# Patient Record
Sex: Female | Born: 1985
Health system: Southern US, Community
[De-identification: ages and names within clinical notes are randomized; demographics above are authoritative.]

## PROBLEM LIST (undated history)

## (undated) ENCOUNTER — Inpatient Hospital Stay (HOSPITAL_COMMUNITY): Payer: Self-pay

## (undated) DIAGNOSIS — D649 Anemia, unspecified: Secondary | ICD-10-CM

## (undated) DIAGNOSIS — K439 Ventral hernia without obstruction or gangrene: Secondary | ICD-10-CM

## (undated) DIAGNOSIS — R87629 Unspecified abnormal cytological findings in specimens from vagina: Secondary | ICD-10-CM

## (undated) DIAGNOSIS — Z8719 Personal history of other diseases of the digestive system: Secondary | ICD-10-CM

## (undated) DIAGNOSIS — K660 Peritoneal adhesions (postprocedural) (postinfection): Secondary | ICD-10-CM

## (undated) DIAGNOSIS — A749 Chlamydial infection, unspecified: Secondary | ICD-10-CM

## (undated) HISTORY — PX: HERNIA REPAIR: SHX51

---
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
## 2004-01-28 ENCOUNTER — Encounter: Admission: RE | Admit: 2004-01-28 | Discharge: 2004-01-28 | Payer: Self-pay | Admitting: Family Medicine

## 2004-01-28 ENCOUNTER — Other Ambulatory Visit: Admission: RE | Admit: 2004-01-28 | Discharge: 2004-01-28 | Payer: Self-pay | Admitting: Family Medicine

## 2004-01-31 ENCOUNTER — Other Ambulatory Visit: Admission: RE | Admit: 2004-01-31 | Discharge: 2004-01-31 | Payer: Self-pay | Admitting: Family Medicine

## 2004-02-17 ENCOUNTER — Encounter: Admission: RE | Admit: 2004-02-17 | Discharge: 2004-02-17 | Payer: Self-pay | Admitting: Sports Medicine

## 2004-02-22 ENCOUNTER — Encounter: Admission: RE | Admit: 2004-02-22 | Discharge: 2004-02-22 | Payer: Self-pay | Admitting: Family Medicine

## 2004-03-08 ENCOUNTER — Encounter: Admission: RE | Admit: 2004-03-08 | Discharge: 2004-03-08 | Payer: Self-pay | Admitting: Family Medicine

## 2004-07-26 ENCOUNTER — Emergency Department (HOSPITAL_COMMUNITY): Admission: EM | Admit: 2004-07-26 | Discharge: 2004-07-26 | Payer: Self-pay | Admitting: Emergency Medicine

## 2004-07-27 ENCOUNTER — Emergency Department (HOSPITAL_COMMUNITY): Admission: EM | Admit: 2004-07-27 | Discharge: 2004-07-27 | Payer: Self-pay | Admitting: Family Medicine

## 2004-08-02 ENCOUNTER — Emergency Department (HOSPITAL_COMMUNITY): Admission: EM | Admit: 2004-08-02 | Discharge: 2004-08-02 | Payer: Self-pay | Admitting: Family Medicine

## 2004-08-13 ENCOUNTER — Emergency Department (HOSPITAL_COMMUNITY): Admission: EM | Admit: 2004-08-13 | Discharge: 2004-08-13 | Payer: Self-pay | Admitting: Family Medicine

## 2004-08-29 ENCOUNTER — Emergency Department (HOSPITAL_COMMUNITY): Admission: EM | Admit: 2004-08-29 | Discharge: 2004-08-29 | Payer: Self-pay | Admitting: Family Medicine

## 2004-11-17 ENCOUNTER — Ambulatory Visit: Payer: Self-pay | Admitting: Family Medicine

## 2005-01-12 ENCOUNTER — Emergency Department (HOSPITAL_COMMUNITY): Admission: EM | Admit: 2005-01-12 | Discharge: 2005-01-12 | Payer: Self-pay | Admitting: Family Medicine

## 2005-01-30 ENCOUNTER — Ambulatory Visit: Payer: Self-pay | Admitting: Sports Medicine

## 2005-02-09 ENCOUNTER — Ambulatory Visit: Payer: Self-pay | Admitting: Family Medicine

## 2005-04-06 ENCOUNTER — Ambulatory Visit: Payer: Self-pay | Admitting: Family Medicine

## 2005-05-26 ENCOUNTER — Emergency Department (HOSPITAL_COMMUNITY): Admission: EM | Admit: 2005-05-26 | Discharge: 2005-05-26 | Payer: Self-pay | Admitting: Family Medicine

## 2005-09-01 ENCOUNTER — Emergency Department (HOSPITAL_COMMUNITY): Admission: EM | Admit: 2005-09-01 | Discharge: 2005-09-01 | Payer: Self-pay | Admitting: Family Medicine

## 2005-09-10 ENCOUNTER — Ambulatory Visit: Payer: Self-pay | Admitting: Family Medicine

## 2005-10-09 ENCOUNTER — Ambulatory Visit: Payer: Self-pay | Admitting: Family Medicine

## 2005-10-12 ENCOUNTER — Ambulatory Visit: Payer: Self-pay | Admitting: Family Medicine

## 2005-11-01 ENCOUNTER — Ambulatory Visit: Payer: Self-pay | Admitting: Family Medicine

## 2005-12-07 ENCOUNTER — Encounter (INDEPENDENT_AMBULATORY_CARE_PROVIDER_SITE_OTHER): Payer: Self-pay | Admitting: *Deleted

## 2005-12-07 LAB — CONVERTED CEMR LAB

## 2005-12-14 ENCOUNTER — Ambulatory Visit: Payer: Self-pay | Admitting: Family Medicine

## 2005-12-17 ENCOUNTER — Ambulatory Visit (HOSPITAL_COMMUNITY): Admission: RE | Admit: 2005-12-17 | Discharge: 2005-12-17 | Payer: Self-pay | Admitting: Family Medicine

## 2005-12-27 ENCOUNTER — Ambulatory Visit: Payer: Self-pay | Admitting: Sports Medicine

## 2005-12-27 ENCOUNTER — Encounter (INDEPENDENT_AMBULATORY_CARE_PROVIDER_SITE_OTHER): Payer: Self-pay | Admitting: Specialist

## 2005-12-27 ENCOUNTER — Other Ambulatory Visit: Admission: RE | Admit: 2005-12-27 | Discharge: 2005-12-27 | Payer: Self-pay | Admitting: Family Medicine

## 2006-01-16 ENCOUNTER — Ambulatory Visit: Payer: Self-pay | Admitting: Family Medicine

## 2006-01-30 ENCOUNTER — Ambulatory Visit: Payer: Self-pay | Admitting: Sports Medicine

## 2006-02-04 ENCOUNTER — Ambulatory Visit (HOSPITAL_COMMUNITY): Admission: RE | Admit: 2006-02-04 | Discharge: 2006-02-04 | Payer: Self-pay | Admitting: Family Medicine

## 2006-02-12 ENCOUNTER — Ambulatory Visit: Payer: Self-pay | Admitting: Family Medicine

## 2006-02-28 ENCOUNTER — Ambulatory Visit: Payer: Self-pay | Admitting: Family Medicine

## 2006-03-26 ENCOUNTER — Ambulatory Visit: Payer: Self-pay | Admitting: Family Medicine

## 2006-03-28 ENCOUNTER — Inpatient Hospital Stay (HOSPITAL_COMMUNITY): Admission: AD | Admit: 2006-03-28 | Discharge: 2006-03-28 | Payer: Self-pay | Admitting: Family Medicine

## 2006-03-28 ENCOUNTER — Ambulatory Visit: Payer: Self-pay | Admitting: Obstetrics and Gynecology

## 2006-04-03 ENCOUNTER — Ambulatory Visit: Payer: Self-pay | Admitting: Family Medicine

## 2006-05-01 ENCOUNTER — Ambulatory Visit: Payer: Self-pay | Admitting: Sports Medicine

## 2006-05-09 ENCOUNTER — Ambulatory Visit: Payer: Self-pay | Admitting: Family Medicine

## 2006-05-27 ENCOUNTER — Ambulatory Visit: Payer: Self-pay | Admitting: Sports Medicine

## 2006-06-11 ENCOUNTER — Ambulatory Visit: Payer: Self-pay | Admitting: Family Medicine

## 2006-06-13 ENCOUNTER — Ambulatory Visit: Payer: Self-pay | Admitting: Family Medicine

## 2006-06-18 ENCOUNTER — Ambulatory Visit: Payer: Self-pay | Admitting: Family Medicine

## 2006-06-27 ENCOUNTER — Ambulatory Visit: Payer: Self-pay | Admitting: Family Medicine

## 2006-07-03 ENCOUNTER — Ambulatory Visit (HOSPITAL_COMMUNITY): Admission: RE | Admit: 2006-07-03 | Discharge: 2006-07-03 | Payer: Self-pay | Admitting: Family Medicine

## 2006-07-11 ENCOUNTER — Encounter (INDEPENDENT_AMBULATORY_CARE_PROVIDER_SITE_OTHER): Payer: Self-pay | Admitting: Family Medicine

## 2006-07-11 ENCOUNTER — Ambulatory Visit: Payer: Self-pay | Admitting: Family Medicine

## 2006-07-11 LAB — CONVERTED CEMR LAB
Chlamydia, DNA Probe: POSITIVE — AB
GC Probe Amp, Genital: POSITIVE — AB

## 2006-07-16 ENCOUNTER — Ambulatory Visit: Payer: Self-pay | Admitting: *Deleted

## 2006-07-16 ENCOUNTER — Inpatient Hospital Stay (HOSPITAL_COMMUNITY): Admission: AD | Admit: 2006-07-16 | Discharge: 2006-07-16 | Payer: Self-pay | Admitting: Obstetrics and Gynecology

## 2006-07-16 ENCOUNTER — Ambulatory Visit: Payer: Self-pay | Admitting: Family Medicine

## 2006-07-23 ENCOUNTER — Inpatient Hospital Stay (HOSPITAL_COMMUNITY): Admission: AD | Admit: 2006-07-23 | Discharge: 2006-07-23 | Payer: Self-pay | Admitting: Obstetrics and Gynecology

## 2006-07-23 ENCOUNTER — Ambulatory Visit: Payer: Self-pay | Admitting: *Deleted

## 2006-09-05 ENCOUNTER — Ambulatory Visit: Payer: Self-pay | Admitting: Family Medicine

## 2006-09-05 ENCOUNTER — Encounter (INDEPENDENT_AMBULATORY_CARE_PROVIDER_SITE_OTHER): Payer: Self-pay | Admitting: Family Medicine

## 2006-09-05 DIAGNOSIS — F17200 Nicotine dependence, unspecified, uncomplicated: Secondary | ICD-10-CM | POA: Insufficient documentation

## 2006-09-05 LAB — CONVERTED CEMR LAB: Chlamydia, DNA Probe: NEGATIVE

## 2006-09-06 ENCOUNTER — Encounter (INDEPENDENT_AMBULATORY_CARE_PROVIDER_SITE_OTHER): Payer: Self-pay | Admitting: *Deleted

## 2006-10-01 ENCOUNTER — Telehealth: Payer: Self-pay | Admitting: *Deleted

## 2006-10-01 ENCOUNTER — Telehealth (INDEPENDENT_AMBULATORY_CARE_PROVIDER_SITE_OTHER): Payer: Self-pay | Admitting: Family Medicine

## 2006-12-11 ENCOUNTER — Ambulatory Visit: Payer: Self-pay | Admitting: Family Medicine

## 2006-12-18 ENCOUNTER — Encounter (INDEPENDENT_AMBULATORY_CARE_PROVIDER_SITE_OTHER): Payer: Self-pay | Admitting: Family Medicine

## 2007-02-13 ENCOUNTER — Ambulatory Visit: Payer: Self-pay | Admitting: Family Medicine

## 2007-05-07 ENCOUNTER — Encounter: Payer: Self-pay | Admitting: *Deleted

## 2007-05-30 ENCOUNTER — Telehealth: Payer: Self-pay | Admitting: *Deleted

## 2007-06-19 ENCOUNTER — Encounter (INDEPENDENT_AMBULATORY_CARE_PROVIDER_SITE_OTHER): Payer: Self-pay | Admitting: Family Medicine

## 2007-06-19 ENCOUNTER — Other Ambulatory Visit: Admission: RE | Admit: 2007-06-19 | Discharge: 2007-06-19 | Payer: Self-pay | Admitting: Family Medicine

## 2007-06-19 ENCOUNTER — Ambulatory Visit: Payer: Self-pay | Admitting: Family Medicine

## 2007-06-19 LAB — CONVERTED CEMR LAB
Beta hcg, urine, semiquantitative: NEGATIVE
GC Probe Amp, Genital: NEGATIVE

## 2007-06-27 ENCOUNTER — Encounter (INDEPENDENT_AMBULATORY_CARE_PROVIDER_SITE_OTHER): Payer: Self-pay | Admitting: Family Medicine

## 2007-07-07 ENCOUNTER — Telehealth: Payer: Self-pay | Admitting: *Deleted

## 2007-07-13 ENCOUNTER — Encounter (INDEPENDENT_AMBULATORY_CARE_PROVIDER_SITE_OTHER): Payer: Self-pay | Admitting: Family Medicine

## 2007-07-22 ENCOUNTER — Ambulatory Visit: Payer: Self-pay | Admitting: Family Medicine

## 2007-07-22 ENCOUNTER — Encounter (INDEPENDENT_AMBULATORY_CARE_PROVIDER_SITE_OTHER): Payer: Self-pay | Admitting: Family Medicine

## 2007-08-27 ENCOUNTER — Telehealth: Payer: Self-pay | Admitting: *Deleted

## 2007-08-28 ENCOUNTER — Ambulatory Visit: Payer: Self-pay | Admitting: Family Medicine

## 2007-10-13 ENCOUNTER — Telehealth: Payer: Self-pay | Admitting: *Deleted

## 2007-10-14 ENCOUNTER — Encounter (INDEPENDENT_AMBULATORY_CARE_PROVIDER_SITE_OTHER): Payer: Self-pay | Admitting: Family Medicine

## 2007-10-14 ENCOUNTER — Ambulatory Visit: Payer: Self-pay | Admitting: Family Medicine

## 2007-10-14 LAB — CONVERTED CEMR LAB
GC Probe Amp, Genital: NEGATIVE
Protein, U semiquant: NEGATIVE
Specific Gravity, Urine: 1.025
Urobilinogen, UA: 0.2
WBC Urine, dipstick: NEGATIVE
pH: 6.5

## 2007-10-21 ENCOUNTER — Telehealth: Payer: Self-pay | Admitting: *Deleted

## 2007-12-15 ENCOUNTER — Telehealth: Payer: Self-pay | Admitting: *Deleted

## 2007-12-16 ENCOUNTER — Ambulatory Visit: Payer: Self-pay | Admitting: Family Medicine

## 2007-12-16 ENCOUNTER — Encounter (INDEPENDENT_AMBULATORY_CARE_PROVIDER_SITE_OTHER): Payer: Self-pay | Admitting: Family Medicine

## 2007-12-16 LAB — CONVERTED CEMR LAB: GC Probe Amp, Genital: NEGATIVE

## 2007-12-17 ENCOUNTER — Encounter (INDEPENDENT_AMBULATORY_CARE_PROVIDER_SITE_OTHER): Payer: Self-pay | Admitting: Family Medicine

## 2007-12-23 ENCOUNTER — Encounter (INDEPENDENT_AMBULATORY_CARE_PROVIDER_SITE_OTHER): Payer: Self-pay | Admitting: Family Medicine

## 2008-03-07 ENCOUNTER — Emergency Department (HOSPITAL_COMMUNITY): Admission: EM | Admit: 2008-03-07 | Discharge: 2008-03-07 | Payer: Self-pay | Admitting: Family Medicine

## 2008-03-09 ENCOUNTER — Emergency Department (HOSPITAL_COMMUNITY): Admission: EM | Admit: 2008-03-09 | Discharge: 2008-03-09 | Payer: Self-pay | Admitting: Family Medicine

## 2008-04-28 ENCOUNTER — Telehealth: Payer: Self-pay | Admitting: *Deleted

## 2008-04-28 ENCOUNTER — Emergency Department (HOSPITAL_COMMUNITY): Admission: EM | Admit: 2008-04-28 | Discharge: 2008-04-28 | Payer: Self-pay | Admitting: Family Medicine

## 2008-05-04 ENCOUNTER — Telehealth (INDEPENDENT_AMBULATORY_CARE_PROVIDER_SITE_OTHER): Payer: Self-pay | Admitting: *Deleted

## 2008-07-07 ENCOUNTER — Telehealth: Payer: Self-pay | Admitting: *Deleted

## 2008-07-08 ENCOUNTER — Telehealth: Payer: Self-pay | Admitting: *Deleted

## 2008-08-03 ENCOUNTER — Telehealth: Payer: Self-pay | Admitting: Family Medicine

## 2008-08-04 ENCOUNTER — Ambulatory Visit: Payer: Self-pay | Admitting: Family Medicine

## 2008-08-04 DIAGNOSIS — K219 Gastro-esophageal reflux disease without esophagitis: Secondary | ICD-10-CM | POA: Insufficient documentation

## 2008-08-16 ENCOUNTER — Telehealth: Payer: Self-pay | Admitting: *Deleted

## 2008-08-19 ENCOUNTER — Encounter: Payer: Self-pay | Admitting: Family Medicine

## 2008-08-19 ENCOUNTER — Ambulatory Visit: Payer: Self-pay | Admitting: Family Medicine

## 2008-08-19 DIAGNOSIS — K439 Ventral hernia without obstruction or gangrene: Secondary | ICD-10-CM | POA: Insufficient documentation

## 2008-08-19 LAB — CONVERTED CEMR LAB: Beta hcg, urine, semiquantitative: NEGATIVE

## 2008-08-23 ENCOUNTER — Encounter: Payer: Self-pay | Admitting: Family Medicine

## 2008-11-03 ENCOUNTER — Ambulatory Visit: Payer: Self-pay | Admitting: Family Medicine

## 2008-11-03 ENCOUNTER — Telehealth: Payer: Self-pay | Admitting: Family Medicine

## 2008-12-14 ENCOUNTER — Ambulatory Visit: Payer: Self-pay | Admitting: Family Medicine

## 2008-12-14 ENCOUNTER — Encounter: Payer: Self-pay | Admitting: Family Medicine

## 2008-12-14 DIAGNOSIS — E669 Obesity, unspecified: Secondary | ICD-10-CM | POA: Insufficient documentation

## 2008-12-14 LAB — CONVERTED CEMR LAB: Hgb A1c MFr Bld: 5.2 %

## 2008-12-22 LAB — CONVERTED CEMR LAB
Chlamydia, DNA Probe: NEGATIVE
GC Probe Amp, Genital: NEGATIVE

## 2008-12-28 ENCOUNTER — Ambulatory Visit: Payer: Self-pay | Admitting: Family Medicine

## 2008-12-28 ENCOUNTER — Encounter: Payer: Self-pay | Admitting: Family Medicine

## 2008-12-28 DIAGNOSIS — R8789 Other abnormal findings in specimens from female genital organs: Secondary | ICD-10-CM | POA: Insufficient documentation

## 2009-01-27 ENCOUNTER — Telehealth: Payer: Self-pay | Admitting: Family Medicine

## 2009-07-07 ENCOUNTER — Ambulatory Visit: Payer: Self-pay | Admitting: Family Medicine

## 2009-07-07 ENCOUNTER — Encounter: Payer: Self-pay | Admitting: Family Medicine

## 2009-07-07 DIAGNOSIS — N938 Other specified abnormal uterine and vaginal bleeding: Secondary | ICD-10-CM | POA: Insufficient documentation

## 2009-07-07 DIAGNOSIS — N949 Unspecified condition associated with female genital organs and menstrual cycle: Secondary | ICD-10-CM

## 2009-07-07 LAB — CONVERTED CEMR LAB
Chlamydia, DNA Probe: NEGATIVE
GC Probe Amp, Genital: NEGATIVE
TSH: 0.595 microintl units/mL (ref 0.350–4.500)
Whiff Test: NEGATIVE

## 2009-07-13 ENCOUNTER — Encounter: Payer: Self-pay | Admitting: Family Medicine

## 2009-09-15 ENCOUNTER — Ambulatory Visit: Payer: Self-pay | Admitting: Family Medicine

## 2009-09-15 ENCOUNTER — Telehealth: Payer: Self-pay | Admitting: Family Medicine

## 2009-09-15 ENCOUNTER — Encounter: Payer: Self-pay | Admitting: Family Medicine

## 2009-09-15 DIAGNOSIS — A64 Unspecified sexually transmitted disease: Secondary | ICD-10-CM | POA: Insufficient documentation

## 2009-09-15 LAB — CONVERTED CEMR LAB
Beta hcg, urine, semiquantitative: NEGATIVE
Chlamydia, Swab/Urine, PCR: NEGATIVE
Glucose, Urine, Semiquant: NEGATIVE
Ketones, urine, test strip: NEGATIVE
Nitrite: NEGATIVE
Protein, U semiquant: NEGATIVE
Specific Gravity, Urine: 1.02
WBC Urine, dipstick: NEGATIVE

## 2009-09-16 ENCOUNTER — Encounter: Payer: Self-pay | Admitting: Family Medicine

## 2009-12-19 ENCOUNTER — Encounter: Payer: Self-pay | Admitting: Family Medicine

## 2010-01-30 ENCOUNTER — Telehealth: Payer: Self-pay | Admitting: Family Medicine

## 2010-06-07 ENCOUNTER — Encounter: Payer: Self-pay | Admitting: Family Medicine

## 2010-08-08 NOTE — Progress Notes (Signed)
Summary: triage  Phone Note Call from Patient Call back at Home Phone [phone]   Caller: Patient Summary of Call: Pt has yeast infection and has been using Monistat since yesterday.  Should she be doing anything else. Initial call taken by: Clydell Hakim,  January 30, 2010 8:45 AM  Follow-up for Phone Call        states she thinks she has a yeast infection. bought OTC Monistat. just started using it. told her she can use it for a few days & see if this clears or come in to see md. she is going to call back in a few days if no improvement. Follow-up by: Golden Circle RN,  January 30, 2010 8:51 AM

## 2010-08-08 NOTE — Progress Notes (Signed)
Summary: triage  Phone Note Call from Patient Call back at [phone]   Caller: Patient Summary of Call: has a itch/burn in the vag area - wants to know what to do Initial call taken by: De Nurse,  September 15, 2009 10:27 AM  Follow-up for Phone Call        has white to yellow discharge as well. wants to be seen today. appt with Dr. Alvester Morin at 2pm Follow-up by: Golden Circle RN,  September 15, 2009 10:38 AM

## 2010-08-08 NOTE — Assessment & Plan Note (Signed)
Summary: vag d/c & itch/[place]   Vital Signs:  Patient profile:   25 year old female Height:      63 inches Weight:      187 pounds BMI:     33.25 BSA:     1.88 Temp:     99.0 degrees F Pulse rate:   69 / minute BP sitting:   110 / 69  Vitals Entered By: Jone Baseman CMA (September 15, 2009 2:06 PM) CC: vag d/c and itch Is Patient Diabetic? No Pain Assessment Patient in pain? no        Primary Care Provider:  Myrtie Soman  MD  CC:  vag d/c and itch.  History of Present Illness: 67 YOF w/ 4 day complaint of vaginal discharge. Pt reports unprotected sex 1 day prior to onset of symptoms. Pt also reports unprotected sex 2 weeks prior w/ different partner. Pt describes discharge as yellow-thick, non odorous. Pt also reports intermittent spotting over last 1-2 days. Pt denies abdominal pain, flu like symptoms. Pt does report intermittent nausea, no emesis. Pt does report hx/o STIs in the past including Chlamydia and Trichomonas.   Habits & Providers  Alcohol-Tobacco-Diet     Tobacco Status: current     Tobacco Counseling: to quit use of tobacco products     Cigarette Packs/Day: 0.5  Allergies: No Known Drug Allergies  Physical Exam  Genitalia:  normal introitus, +vaginal discharge (thick whitish mucus like), cervical friability.  Minimal cervical motion tenderness on L side w/ bimanual exam.    Impression & Recommendations:  Problem # 1:  SEXUALLY TRANSMITTED DISEASE (ICD-099.9) Pt w/ likely GC/Chl infection given history and clinical exam. Plan to treat w/ cefexime 400mg  by mouth and Azithromycin 2gm by mouth.Discussed possibility of HIV infection. However, pt feels this is not likely. Denies flu-like symptoms. Does not desire HIV tresting. Pt left prior results of wet prep or clinical discussion of diagnosis. Phone number left by patient to call back at is disconnected, pt not present/unavailable at work number. Plan to send generic letter informing pt that  prescriptions are available for pickup at [facility] at her earliest convenience.   Complete Medication List: 1)  Flexeril 10 Mg Tabs (Cyclobenzaprine hcl) .... One by mouth every 8 hours as needed for pain 2)  Ibu 800 Mg Tabs (Ibuprofen) .... One by mouth every 8 hrs as needed for pain; take with food 3)  Loestrin 1/20 (21) 1-20 Mg-mcg Tabs (Norethindrone acet-ethinyl est) .... One by mouth daily per package instructions  Other Orders: Urinalysis-[facility] (00000) U Preg-[facility] [phone]) GC/Chlamydia-[facility] (87591/87491) [facility] [phone])  Laboratory Results   Urine Tests  Date/Time Received: September 15, 2009 2:41 PM  Date/Time Reported: September 15, 2009 3:04 PM   Routine Urinalysis   Color: yellow Appearance: Clear Glucose: negative   (Normal Range: Negative) Bilirubin: negative   (Normal Range: Negative) Ketone: negative   (Normal Range: Negative) Spec. Gravity: 1.020   (Normal Range: 1.003-1.035) Blood: negative   (Normal Range: Negative) pH: 7.0   (Normal Range: 5.0-8.0) Protein: negative   (Normal Range: Negative) Urobilinogen: 1.0   (Normal Range: 0-1) Nitrite: negative   (Normal Range: Negative) Leukocyte Esterace: negative   (Normal Range: Negative)    Urine HCG: negative Comments: ...........test performed by...........Marland KitchenTerese Door, CMA  Date/Time Received: September 15, 2009 2:41 PM  Date/Time Reported: September 15, 2009 2:58 PM   [employer] Source: vaginal WBC/hpf: 10-20 Bacteria/hpf: 3+  Rods Clue cells/hpf: none  Negative whiff Yeast/hpf: none Trichomonas/hpf: none  Comments: ...........test performed by...........Marland KitchenTerese Door, CMA     Appended Document: vag d/c & itch/[place]: Clinic charges     Allergies: No Known Drug Allergies   Complete Medication List: 1)  Flexeril 10 Mg Tabs (Cyclobenzaprine hcl) .... One by mouth every 8 hours as needed for pain 2)  Ibu 800 Mg Tabs (Ibuprofen) .... One by mouth every 8 hrs as needed for pain; take with food 3)   Loestrin 1/20 (21) 1-20 Mg-mcg Tabs (Norethindrone acet-ethinyl est) .... One by mouth daily per package instructions 4)  Azithromycin 500 Mg Tabs (Azithromycin) .... Take 4 tablets by mouth once 5)  Suprax 400 Mg Tabs (Cefixime) .Marland Kitchen.. 1 tablet by mouth once  Other Orders: [facility]- Est  Level 4 (04540)

## 2010-08-08 NOTE — Letter (Signed)
Summary: Generic Letter  [name]  [address]   [place], [place] [address]   Phone: [phone]  Fax: [phone]    09/16/2009  [facility] [address] [place], [place]  [address]  Dear Ms. Herendeen,  I wanted to make you aware of your overall diagnosis from your most recent clinic visit.However, you left before we could discuss things.  I attempted to contact via phone. However, the phone number that was given was disconnected and you were not present at your work number. Reviewing the results of some of your laboratory studies from your most recent clinic visit and your overall clinical history and exam, it is highly likely that you may have Gonorrheal and/or Chlamydial infection (your lab cultures are not final yet). You will need antibiotics for these infections. The antibiotics that you need are available for pickup at the [facility] at your earliest convenience. I hope this will get you feeling better. If you have any questions please feel free to call the [facility] at your earliest convenience.         Sincerely,   Doree Albee MD  Appended Document: Generic Letter mailed

## 2010-08-08 NOTE — Letter (Signed)
Summary: Generic Letter  [name]  [address]   [place], [place] [address]   Phone: [phone]  Fax: [phone]    07/13/2009  [facility] [address] [place], [place]  [address]  Dear Ms. Rodda,  I wanted to let you know that your lab testing from the other day all came back normal. This includes the STD testing (including HIV). Your thyroid test was also normal. Please call with any questions. Nice to see you the other day.  Sincerely,   Myrtie Soman  MD  Appended Document: Generic Letter mailed.

## 2010-08-08 NOTE — Miscellaneous (Signed)
Summary: chart update  Clinical Lists Changes  Problems: Removed problem of VAGINAL DISCHARGE (ICD-623.5) Removed problem of LOW BACK PAIN, ACUTE (ICD-724.2) Removed problem of METATARSALGIA (ICD-726.70) Medications: Removed medication of SUPRAX 400 MG TABS (CEFIXIME) 1 tablet by mouth once Removed medication of AZITHROMYCIN 500 MG TABS (AZITHROMYCIN) take 4 tablets by mouth once Observations: Added new observation of PRIMARY MD: Myrtie Soman  MD (12/19/2009 10:59)       Past History:  Past Medical History: Last updated: 12/14/2008 h/o bacterial vaginosis, trichomonas (11/2004) (2/09), GC/Chl neg G2P2 (SVD 10/03, C-section 1/08) LGSIL on pap 1/09- colpo benign Ventral hernia -- referred to Dr. Dwain Sarna [facility] 2/10 (considering surgical repair)  Past Surgical History: Last updated: 02/13/2007 forehead cyst removal - 02/07/2000 Csection 07/24/06 for fetal arrthymia

## 2010-08-08 NOTE — Miscellaneous (Signed)
Summary: Problem list     Past History:  Past Medical History: Past problems: -h/o bacterial vaginosis, trichomonas (11/2004) (2/09), GC/Chl neg -G2P2 (SVD 10/03, C-section 1/08) -LGSIL on pap 1/09- colpo benign -Ventral hernia -- referred to Dr. Dwain Sarna [facility] 2/10 (considering surgical repair)

## 2010-08-10 ENCOUNTER — Encounter (HOSPITAL_BASED_OUTPATIENT_CLINIC_OR_DEPARTMENT_OTHER)
Admission: RE | Admit: 2010-08-10 | Discharge: 2010-08-10 | Disposition: A | Discharge status: Home / Self Care | Payer: Self-pay | Source: Ambulatory Visit | Attending: General Surgery | Admitting: General Surgery

## 2010-08-10 DIAGNOSIS — Z01812 Encounter for preprocedural laboratory examination: Secondary | ICD-10-CM | POA: Insufficient documentation

## 2010-08-10 LAB — CBC
Hemoglobin: 11.7 g/dL — ABNORMAL LOW (ref 12.0–15.0)
MCH: 28.8 pg (ref 26.0–34.0)
MCHC: 32.2 g/dL (ref 30.0–36.0)
MCV: 89.4 fL (ref 78.0–100.0)

## 2010-08-10 LAB — DIFFERENTIAL
Basophils Absolute: 0 10*3/uL (ref 0.0–0.1)
Monocytes Absolute: 0.5 10*3/uL (ref 0.1–1.0)
Monocytes Relative: 9 % (ref 3–12)
Neutrophils Relative %: 54 % (ref 43–77)

## 2010-08-10 LAB — BASIC METABOLIC PANEL
BUN: 12 mg/dL (ref 6–23)
CO2: 25 mEq/L (ref 19–32)
Calcium: 8.8 mg/dL (ref 8.4–10.5)
Chloride: 105 mEq/L (ref 96–112)
Creatinine, Ser: 0.85 mg/dL (ref 0.4–1.2)
GFR calc non Af Amer: >60 mL/min (ref >60)
Glucose, Bld: 110 mg/dL — ABNORMAL HIGH (ref 70–99)
Potassium: 4 mEq/L (ref 3.5–5.1)
Sodium: 137 mEq/L (ref 135–145)

## 2010-08-10 LAB — PREGNANCY, URINE: Preg Test, Ur: NEGATIVE

## 2010-08-11 ENCOUNTER — Ambulatory Visit (HOSPITAL_BASED_OUTPATIENT_CLINIC_OR_DEPARTMENT_OTHER)
Admission: RE | Admit: 2010-08-11 | Discharge: 2010-08-11 | Disposition: A | Discharge status: Home / Self Care | Payer: Self-pay | Attending: General Surgery | Admitting: General Surgery

## 2010-08-11 ENCOUNTER — Other Ambulatory Visit: Payer: Self-pay | Admitting: General Surgery

## 2010-08-11 DIAGNOSIS — K436 Other and unspecified ventral hernia with obstruction, without gangrene: Secondary | ICD-10-CM | POA: Insufficient documentation

## 2010-08-11 DIAGNOSIS — F17200 Nicotine dependence, unspecified, uncomplicated: Secondary | ICD-10-CM | POA: Insufficient documentation

## 2010-08-14 NOTE — Op Note (Signed)
  [name], [name]         ACCOUNT NO.:  [id number]  MEDICAL RECORD NO.:  [id number]          PATIENT TYPE:  AMB  LOCATION:  DSC                          FACILITY:  [facility]  PHYSICIAN:  Adolph Pollack, M.D.DATE OF BIRTH:  [birth year]  DATE OF PROCEDURE:  08/11/2010 DATE OF DISCHARGE:                              OPERATIVE REPORT   PREOPERATIVE DIAGNOSIS:  Ventral hernia.  POSTOPERATIVE DIAGNOSIS:  Chronically incarcerated ventral hernia.  PROCEDURE:  Repair of chronically incarcerated ventral hernia with mesh.  SURGEON:  Adolph Pollack, MD  ANESTHESIA:  General plus Marcaine local.  INDICATION:  Sabrina Buckley is a 25 year old female who has had a somewhat of an epigastric bulge for some time.  It is becoming larger and more uncomfortable.  On exam, she has an epigastric bulge that is not completely reducible.  It appears to be consistent with a ventral hernia and now she presents for repair.  We discussed the procedure risks and aftercare preoperatively.  TECHNIQUE:  She was seen in the holding area and brought to the operating room, placed supine on the operating table and general anesthetic was administered.  The abdominal wall was sterilely prepped and draped.  In the epigastric region inferiorly, I made a longitudinal incision directly over the bulge and carried this through the subcutaneous tissue.  I then noted the hernia sac containing what appeared to be extraperitoneal fat.  I dissected this down to the fascia and exposed fascia for 3-4 cm around this area.  I then incised the sac and noted a small fascial defect.  I subsequently resected a large amount of fatty tissue and the sac and sent it to pathology.  I then reduced a small amount of fatty tissue through the 1-cm hernia defect.  The 1-cm defect was then primarily closed with interrupted 0 Surgilon sutures left long.  A piece of polypropylene mesh was brought into the field and cut so that there  will be 3 cm of overlap all around the primary repair.  I threaded the primary repair sutures through the mesh and tied them down anchoring the mesh directly over the primary repair. The periphery of the mesh was then anchored to the fascia with a running 0 Prolene suture.  This provided for good coverage of the defect with adequate overlap.  Following this, I injected local anesthetic superficially and deep and injected the fascia with local anesthetic consisting of 0.5% Marcaine. I inspected the wound and hemostasis was adequate.  The subcutaneous tissue was then closed with a running 3-0 Vicryl suture.  The skin was closed with a running 4-0 Monocryl subcuticular stitch.  Steri-Strips and sterile dressings were applied.  She tolerated the procedure well without any apparent complications and was taken to recovery room in satisfactory condition.     Adolph Pollack, M.D.     Kari Baars  D:  08/11/2010  T:  08/12/2010  Job:  [id number]  cc:   Shelbie Proctor. Shawnie Pons, M.D.  Electronically Signed by Avel Peace M.D. on 08/14/2010 02:40:38 PM

## 2010-08-22 ENCOUNTER — Encounter: Payer: Self-pay | Admitting: *Deleted

## 2010-10-27 ENCOUNTER — Other Ambulatory Visit: Payer: Self-pay

## 2010-11-03 ENCOUNTER — Encounter: Payer: Self-pay | Admitting: Family Medicine

## 2010-11-21 ENCOUNTER — Inpatient Hospital Stay (HOSPITAL_COMMUNITY)
Admission: AD | Admit: 2010-11-21 | Discharge: 2010-11-21 | Disposition: A | Discharge status: Home / Self Care | Payer: Medicaid Other | Source: Ambulatory Visit | Attending: Obstetrics | Admitting: Obstetrics

## 2010-11-21 DIAGNOSIS — O200 Threatened abortion: Secondary | ICD-10-CM

## 2010-11-21 LAB — URINALYSIS, ROUTINE W REFLEX MICROSCOPIC
Glucose, UA: NEGATIVE mg/dL
Leukocytes, UA: NEGATIVE
Specific Gravity, Urine: 1.025 (ref 1.005–1.030)
pH: 6 (ref 5.0–8.0)

## 2010-11-21 LAB — URINE MICROSCOPIC-ADD ON

## 2010-11-24 NOTE — Discharge Summary (Signed)
   [name], Sabrina Buckley                   ACCOUNT NO.:  [id number]   MEDICAL RECORD NO.:  [id number]                   PATIENT TYPE:  INP   LOCATION:  9151                                 [facility]   PHYSICIAN:  Enid Cutter, M.D.                  DATE OF BIRTH:  Oct 12, 1985   DATE OF ADMISSION:  01/15/2002  DATE OF DISCHARGE:  01/20/2002                                 DISCHARGE SUMMARY   REASON FOR ADMISSION:  Preterm labor.   PRINCIPAL DIAGNOSIS:  A 25 year old gravida 1, para 0 at 24 weeks 2 days  with preterm labor.   ADDITIONAL DIAGNOSIS:  History of sexually transmitted diseases.   HOSPITAL COURSE:  The patient is a 25 year old G-1, P-0 who presented to the  [facility] on 01/15/02 with a history of being seen in the radiology  department for cervical shortening.  At that time she was found to have  shortened cervix 0.68 cm both frontal and internal os.  She was referred to  the maternity admissions unit which revealed fetal heart rate in 140 to 145  with good variability and occasional excels.  The patient was having  contractions approximately four per hour.  Cervical exam revealed 2 to 3 cm  dilated and 1 cm in length cervix.  The patient was admitted and started on  IV antibiotics, magnesium sulfate and given betamethasone. The patient  tolerated the magnesium well. She had a history of Chlamydia and was given  treatment for Chlamydia secondary to cervical discharge.  The patient  continued on magnesium without contractions.  She was weaned off her  magnesium and started on Procardia.  The patient went fetal fibronectin  testing 01/20/02.  This was found to be negative.  The patient had stable  cervical exam per her hospitalization.  She was discharged home on 01/20/02  for bed rest and pelvic rest.  She was treated with Azithromycin for  __________ hospital and should have a follow up test __________ after that.  She is to follow up in the high risk  clinic that week.                                               Enid Cutter, M.D.    [facility]/MEDQ  D:  03/12/2002  T:  03/12/2002  Job:  [id number]

## 2010-11-24 NOTE — Op Note (Signed)
[place]  Patient:    Sabrina Buckley, Sabrina Buckley                  MRN: [id number] Proc. Date: 02/22/00 Adm. Date:  [id number] Attending:  Carlos Levering CC:         [facility]                           Operative Report  PREOPERATIVE DIAGNOSIS:  Recurrent cyst of left forehead.  POSTOPERATIVE DIAGNOSIS:  Recurrent cyst of left forehead.  OPERATION PERFORMED:  Excision of recurrent cyst of left forehead and layered repair.  SURGEON:  Prabhakar D. Levie Heritage, M.D.  ASSISTANT:  Nurse.  ANESTHESIA:  Nurse.  DESCRIPTION OF PROCEDURE:  Under satisfactory general endotracheal anesthesia, with the patient in supine position, the left forehead region was thoroughly prepped and draped in the usual manner.  A 2 cm long transverse incision was made over the previous scar tissue of excision of the left forehead cyst.  The skin and subcutaneous tissues were incised.  There were dense adhesions around the cyst which were lysed by sharp and blunt dissection.  The entire cyst was removed in total without rupture of the cyst.  The wound was irrigated. Hemostasis was accomplished.  Deeper layers approximated with 6-0 Vicryl subcuticular suture.  Skin closed with 5-0 Monocryl subcuticular sutures. Steri-Strips applied.  Pressure dressing applied.  Throughout the procedure, the patients vital signs remained stable.  The patient withstood the procedure well and was transferred to the recovery room in satisfactory general condition. DD:  02/22/00 TD:  02/22/00 Job: [id number]

## 2010-11-24 NOTE — Discharge Summary (Signed)
   [name], [name]                   ACCOUNT NO.:  [id number]   MEDICAL RECORD NO.:  [id number]                   PATIENT TYPE:  INP   LOCATION:  9120                                 [facility]ILITY:  [facility]   PHYSICIAN:  Sabrina Buckley, M.D.             DATE OF BIRTH:  Dec 23, 1985   DATE OF ADMISSION:  04/21/2002  DATE OF DISCHARGE:  04/24/2002                                 DISCHARGE SUMMARY   HOSPITAL COURSE:  The patient is a 25 year old G1, P0 who presented at 38  weeks with a nonreactive fetal heart rate.  The patient was in active labor  with spontaneous rupture of membranes and was also found to be group B Strep  positive.  She also had an elevated blood pressure upon admission of 150/90.  The patient has denied any PIH symptoms.  She was having pain from her  contractions and had an epidural.  The patient did have some nonreassuring  fetal heart tracings with some late decelerations.  Mother was given oxygen.  Maternal position was changed and this was discussed with Dr. Gavin Potters.  It  was also felt patient had possible preeclampsia and thus [facility] laboratories  were drawn.  DIC panel and PIH laboratories were within normal limits.  Blood pressure stabilized to 120-130/60-70 and patient delivered on October  15 without an epidural and no IV pain medication.  The patient had  spontaneous vaginal delivery of OA to LOA at 0150.  Posterior shoulder was  delivered with the right hand present.  There was no nuchal cord.  Baby was  bulb suctioned.  Cord was clamped and cut.  Apgars for the baby were 8 at  one minute, 9 at five minutes.  Placenta was delivered spontaneously, had an  intact three vessel cord, and placenta did have generalized calcifications  and there was a clot along the placental border.  It was sent to pathology.  The perineum and vagina were intact and the patient was monitored overnight.  The patient continued to deny any PIH symptoms.  The patient did well and  was discharged home with prenatal vitamins and iron b.i.d. for a hemoglobin  of 8.4 and ibuprofen for pain.  The patient was to follow up with Dr. Wilson Singer  at the [facility] in six weeks and received Depo Provera for  contraception.  The patient was rubella immune.     Sabrina Buckley, M.D.                  Sabrina Buckley, M.D.    CM/MEDQ  D:  06/08/2002  T:  06/09/2002  Job:  [id number]

## 2011-07-10 HISTORY — PX: UMBILICAL HERNIA REPAIR: SHX196

## 2012-02-17 ENCOUNTER — Encounter (HOSPITAL_COMMUNITY): Payer: Self-pay | Admitting: *Deleted

## 2012-02-17 ENCOUNTER — Emergency Department (HOSPITAL_COMMUNITY)
Admission: EM | Admit: 2012-02-17 | Discharge: 2012-02-17 | Disposition: A | Discharge status: Home / Self Care | Payer: Self-pay | Source: Home / Self Care

## 2012-02-17 ENCOUNTER — Emergency Department (HOSPITAL_COMMUNITY)
Admission: EM | Admit: 2012-02-17 | Discharge: 2012-02-17 | Discharge status: Against Medical Advice | Payer: Medicaid Other | Attending: Emergency Medicine | Admitting: Emergency Medicine

## 2012-02-17 DIAGNOSIS — M545 Low back pain, unspecified: Secondary | ICD-10-CM | POA: Insufficient documentation

## 2012-02-17 DIAGNOSIS — N898 Other specified noninflammatory disorders of vagina: Secondary | ICD-10-CM | POA: Insufficient documentation

## 2012-02-17 DIAGNOSIS — R109 Unspecified abdominal pain: Secondary | ICD-10-CM

## 2012-02-17 DIAGNOSIS — F17200 Nicotine dependence, unspecified, uncomplicated: Secondary | ICD-10-CM | POA: Insufficient documentation

## 2012-02-17 LAB — POCT PREGNANCY, URINE: Preg Test, Ur: NEGATIVE

## 2012-02-17 LAB — URINALYSIS, ROUTINE W REFLEX MICROSCOPIC
Glucose, UA: NEGATIVE mg/dL
Hgb urine dipstick: NEGATIVE
Ketones, ur: NEGATIVE mg/dL
pH: 6.5 (ref 5.0–8.0)

## 2012-02-17 LAB — URINE MICROSCOPIC-ADD ON

## 2012-02-17 NOTE — ED Notes (Signed)
On depo since [facility] 2012

## 2012-02-17 NOTE — ED Provider Notes (Signed)
History     CSN: [id number]  Arrival date & time 02/17/12  1455   First MD Initiated Contact with Patient 02/17/12 1831      Chief Complaint  Patient presents with  . Back Pain  . Vaginal Discharge    (Consider location/radiation/quality/duration/timing/severity/associated sxs/prior treatment) HPI Comments: Patient reports a 3 day history of lower abdominal pain. She reports the pain starting gradually, feeling dull and radiating to her low back. The pain is moderate and she denies any alleviating factors. She reports associated dyspareunia and vaginal discharge with odor. She also reports dysuria and increased urinary frequency. Her last menstrual cycle was a few weeks ago. She uses Depo-provera for birth control. She denies NVD, fever.   Patient is a 26 y.o. female presenting with back pain and vaginal discharge.  Back Pain  Associated symptoms include dysuria. Pertinent negatives include no chest pain, no fever, no numbness, no headaches, no abdominal pain and no weakness.  Vaginal Discharge Pertinent negatives include no abdominal pain, chest pain, chills, diaphoresis, fatigue, fever, headaches, nausea, numbness, vomiting or weakness.    History reviewed. No pertinent past medical history.  History reviewed. No pertinent past surgical history.  History reviewed. No pertinent family history.  History  Substance Use Topics  . Smoking status: Current Everyday Smoker    Types: Cigarettes  . Smokeless tobacco: Not on file  . Alcohol Use: No    OB History    Grav Para Term Preterm Abortions TAB SAB Ect Mult Living                  Review of Systems  Constitutional: Negative for fever, chills, diaphoresis and fatigue.  Respiratory: Negative for shortness of breath and wheezing.   Cardiovascular: Negative for chest pain.  Gastrointestinal: Negative for nausea, vomiting, abdominal pain, diarrhea and abdominal distention.  Genitourinary: Positive for dysuria, frequency,  vaginal discharge and dyspareunia. Negative for flank pain.  Musculoskeletal: Positive for back pain.  Neurological: Negative for dizziness, weakness, numbness and headaches.    Allergies  Review of patient's allergies indicates no known allergies.  Home Medications   Current Outpatient Rx  Name Route Sig Dispense Refill  . MEDROXYPROGESTERONE ACETATE 150 MG/ML IM SUSP Intramuscular Inject 150 mg into the muscle every 3 (three) months.      BP 114/74  Pulse 63  Temp 99.5 F (37.5 C) (Oral)  Resp 16  SpO2 98%  Physical Exam  Nursing note and vitals reviewed. Constitutional: She is oriented to person, place, and time. She appears well-developed and well-nourished. No distress.  HENT:  Head: Normocephalic and atraumatic.  Mouth/Throat: No oropharyngeal exudate.  Eyes: Conjunctivae are normal. Pupils are equal, round, and reactive to light.  Neck: Normal range of motion.  Cardiovascular: Normal rate and regular rhythm.  Exam reveals no gallop and no friction rub.   No murmur heard. Pulmonary/Chest: Effort normal and breath sounds normal. No respiratory distress. She has no wheezes. She has no rales. She exhibits no tenderness.  Abdominal: Soft. She exhibits no distension. There is no rebound and no guarding.       Tenderness to palpation of mid lower abdomen.   Musculoskeletal: Normal range of motion.  Neurological: She is alert and oriented to person, place, and time.  Skin: Skin is warm and dry. She is not diaphoretic.  Psychiatric: She has a normal mood and affect. Her behavior is normal.    ED Course  Procedures (including critical care time)  Labs Reviewed  URINALYSIS, ROUTINE  W REFLEX MICROSCOPIC - Abnormal; Notable for the following:    APPearance CLOUDY (*)     Bilirubin Urine SMALL (*)     Leukocytes, UA TRACE (*)     All other components within normal limits  URINE MICROSCOPIC-ADD ON - Abnormal; Notable for the following:    Squamous Epithelial / LPF MANY (*)      All other components within normal limits  POCT PREGNANCY, URINE   No results found.   No diagnosis found.    MDM  8:14 PM Patient denies pain that warrants pain medication. I will do a pelvic exam as well as swab for GC/Chlamydia.  Patient left AMA because she did not want to wait and will see her doctor in the morning.       Emilia Beck, [place] 02/21/12 [phone]

## 2012-02-17 NOTE — ED Notes (Signed)
Pt states she is tired of waiting to be seen and will seek medical f/up in AM

## 2012-02-17 NOTE — ED Notes (Signed)
Reports having lower back pain for several days, also having vaginal discharge and odor. Burning pain with urination.

## 2012-02-18 ENCOUNTER — Encounter: Payer: Self-pay | Admitting: Family Medicine

## 2012-02-18 ENCOUNTER — Ambulatory Visit (INDEPENDENT_AMBULATORY_CARE_PROVIDER_SITE_OTHER): Payer: Medicaid Other | Admitting: Family Medicine

## 2012-02-18 ENCOUNTER — Other Ambulatory Visit (HOSPITAL_COMMUNITY)
Admission: RE | Admit: 2012-02-18 | Discharge: 2012-02-18 | Disposition: A | Discharge status: Home / Self Care | Payer: Medicaid Other | Source: Ambulatory Visit | Attending: Family Medicine | Admitting: Family Medicine

## 2012-02-18 VITALS — BP 104/71 | HR 92 | Temp 98.6°F | Wt 189.0 lb

## 2012-02-18 DIAGNOSIS — Z113 Encounter for screening for infections with a predominantly sexual mode of transmission: Secondary | ICD-10-CM | POA: Insufficient documentation

## 2012-02-18 DIAGNOSIS — N760 Acute vaginitis: Secondary | ICD-10-CM | POA: Insufficient documentation

## 2012-02-18 DIAGNOSIS — R8789 Other abnormal findings in specimens from female genital organs: Secondary | ICD-10-CM

## 2012-02-18 DIAGNOSIS — Z01419 Encounter for gynecological examination (general) (routine) without abnormal findings: Secondary | ICD-10-CM | POA: Insufficient documentation

## 2012-02-18 DIAGNOSIS — B977 Papillomavirus as the cause of diseases classified elsewhere: Secondary | ICD-10-CM

## 2012-02-18 DIAGNOSIS — R109 Unspecified abdominal pain: Secondary | ICD-10-CM

## 2012-02-18 DIAGNOSIS — Z8619 Personal history of other infectious and parasitic diseases: Secondary | ICD-10-CM | POA: Insufficient documentation

## 2012-02-18 DIAGNOSIS — Z124 Encounter for screening for malignant neoplasm of cervix: Secondary | ICD-10-CM

## 2012-02-18 LAB — POCT URINALYSIS DIPSTICK
Glucose, UA: NEGATIVE
Leukocytes, UA: NEGATIVE
Protein, UA: NEGATIVE
Urobilinogen, UA: 0.2

## 2012-02-18 LAB — POCT WET PREP (WET MOUNT): Clue Cells Wet Prep Whiff POC: NEGATIVE

## 2012-02-18 LAB — POCT URINE PREGNANCY: Preg Test, Ur: NEGATIVE

## 2012-02-18 MED ORDER — FLUCONAZOLE 150 MG PO TABS: 150.0000 mg | ORAL_TABLET | Freq: Once | ORAL | Status: DC

## 2012-02-18 NOTE — Patient Instructions (Addendum)
Dear Sabrina Buckley,   It was great to see you today. Thank you for coming to clinic. Please read below regarding the issues that we discussed.   1. You have a yeast infection. I have sent in a prescription for diflucan (1 pill only) 2. We have checked you for HIV/syphills/gonorrhea/chlamydia. We will let you know by phone if any are positive. Otherwise, we will send a letter.  3. We did a pap today. You may need follow up for this since you had an abnormal test in the past. I will have someone call if you need follow up.   Sincerely,  Dr. Tana Conch

## 2012-02-19 ENCOUNTER — Telehealth: Payer: Self-pay | Admitting: Family Medicine

## 2012-02-19 DIAGNOSIS — Z20828 Contact with and (suspected) exposure to other viral communicable diseases: Secondary | ICD-10-CM

## 2012-02-19 DIAGNOSIS — A5609 Other chlamydial infection of lower genitourinary tract: Secondary | ICD-10-CM | POA: Insufficient documentation

## 2012-02-19 DIAGNOSIS — Z202 Contact with and (suspected) exposure to infections with a predominantly sexual mode of transmission: Secondary | ICD-10-CM

## 2012-02-19 MED ORDER — AZITHROMYCIN 500 MG PO TABS: 1000.0000 mg | ORAL_TABLET | Freq: Once | ORAL | Status: AC

## 2012-02-19 NOTE — Telephone Encounter (Signed)
Patient informed of message from MD and that Rx was sent to her pharmacy, patient expressed understanding.

## 2012-02-19 NOTE — Assessment & Plan Note (Addendum)
Given history of trichomonas and recent unprotected sex, will screen for HIV syphilis gonorrhea and Chlamydia. Trichomonas negative on wet prep. Encouraged patient to use protection

## 2012-02-19 NOTE — Progress Notes (Signed)
Subjective:  Same day appointment   1. vaginal discharge/abdominal pain-over the last 4 days patient has developed vaginal discharge, vaginal itching, burning with urination. She notices a fishy smell with the discharge. She also complains of some suprapubic abdominal pain. She has not recently been treated with any antibiotics. She denies fevers chills nausea vomiting or polyuria. She's currently on Depo-Provera with her last shot being on July 15.   2. Contact with venereal disease-patient has recently become socially active with a new partner. She has not been using protection every time.  3. History of abnormal Pap-patient reports [facility]. She is unsure if she's had a Pap smear since 2010. She does remember having a colposcopy at that time but cannot Amer she's had any repeats.  ROS--See HPI  Past Medical History-history of trichomonas and no other STDs.  Reviewed problem list.  Medications- reviewed and updated Chief complaint-noted  Objective:  Gen: NAD CV: RRR no mrg Lungs: CTAB Abdominal: Soft nontender nondistended. Cannot reproduce abdominal pain on exam.  GU-normal external anatomy. Bimanual exam without cervical motion tenderness. No adnexal tenderness. On speculum exam white discharge noted No discharge from cervical os. MSK:No edema  Skin: No rash, warm dry  Assessment/Plan: See problem oriented charted

## 2012-02-19 NOTE — Assessment & Plan Note (Addendum)
Urinalysis reviewed and unremarkable. Urine pregnancy negative. Wet prep with many yeast. Will treat with Diflucan. Also obtained gonorrhea and Chlamydia-pending. No cervical motion tenderness to reflect pelvic inflammatory disease.

## 2012-02-19 NOTE — Telephone Encounter (Signed)
Left voicemail for patient to return call.   Patient with chlamydia noted on testing yesterday. No gonorrhea. Will treat with 1g of Azithromycin. Will advise patient no sex for 7 days after treatment (including both you and any sexual partners). Patient needs to have all sexual partners checked for STDs.   Also placed future orders for HIV/syphillis as appears was not obtained in office the other day. Please advise patient on return call to come in for this testing.

## 2012-02-19 NOTE — Assessment & Plan Note (Deleted)
Given history of trichomonas and recent unprotected sex, will screen for HIV syphilis gonorrhea and Chlamydia.

## 2012-02-19 NOTE — Assessment & Plan Note (Signed)
Repeat Pap today. Given history of abnormal Pap, believe patient would benefit from a yearly PAP at minimum. Will refer to colposcopy clinic if needed.

## 2012-02-25 ENCOUNTER — Encounter: Payer: Self-pay | Admitting: Family Medicine

## 2012-02-26 NOTE — ED Provider Notes (Signed)
Medical screening examination/treatment/procedure(s) were performed by non-physician practitioner and as supervising physician I was immediately available for consultation/collaboration.  Cheri Guppy, MD 02/26/12 [phone]

## 2012-03-30 ENCOUNTER — Emergency Department (HOSPITAL_COMMUNITY)
Admission: EM | Admit: 2012-03-30 | Discharge: 2012-03-30 | Disposition: A | Discharge status: Home / Self Care | Payer: Self-pay | Source: Home / Self Care

## 2012-03-30 ENCOUNTER — Encounter (HOSPITAL_COMMUNITY): Payer: Self-pay | Admitting: *Deleted

## 2012-03-30 DIAGNOSIS — Z202 Contact with and (suspected) exposure to infections with a predominantly sexual mode of transmission: Secondary | ICD-10-CM

## 2012-03-30 DIAGNOSIS — B9689 Other specified bacterial agents as the cause of diseases classified elsewhere: Secondary | ICD-10-CM

## 2012-03-30 DIAGNOSIS — Z9189 Other specified personal risk factors, not elsewhere classified: Secondary | ICD-10-CM

## 2012-03-30 DIAGNOSIS — A499 Bacterial infection, unspecified: Secondary | ICD-10-CM

## 2012-03-30 DIAGNOSIS — N760 Acute vaginitis: Secondary | ICD-10-CM

## 2012-03-30 HISTORY — DX: Chlamydial infection, unspecified: A74.9

## 2012-03-30 LAB — POCT URINALYSIS DIP (DEVICE)
Leukocytes, UA: NEGATIVE
Nitrite: NEGATIVE
Protein, ur: NEGATIVE mg/dL
Urobilinogen, UA: 0.2 mg/dL (ref 0.0–1.0)

## 2012-03-30 LAB — WET PREP, GENITAL
Clue Cells Wet Prep HPF POC: NONE SEEN
Trich, Wet Prep: NONE SEEN

## 2012-03-30 LAB — POCT PREGNANCY, URINE: Preg Test, Ur: NEGATIVE

## 2012-03-30 MED ORDER — METRONIDAZOLE 500 MG PO TABS: 500.0000 mg | ORAL_TABLET | Freq: Two times a day (BID) | ORAL | Status: DC

## 2012-03-30 MED ORDER — AZITHROMYCIN 500 MG PO TABS: ORAL_TABLET | ORAL | Status: DC

## 2012-03-30 NOTE — ED Provider Notes (Signed)
History     CSN: [id number]  Arrival date & time 03/30/12  1726   None     Chief Complaint  Patient presents with  . Vaginal Discharge    (Consider location/radiation/quality/duration/timing/severity/associated sxs/prior treatment) HPI Comments: 26 year old female presents with pelvic cramps low back pain and vaginal discharge. 3 weeks ago she was treated for Chlamydia and then about 5 days later  had sexual intercourse with her partner that communicating the disease the first time. She was to be checked for vaginal discharge and possible repeat exposure to chlamydia.   Past Medical History  Diagnosis Date  . Chlamydia     Past Surgical History  Procedure Date  . Hernia repair     No family history on file.  History  Substance Use Topics  . Smoking status: Current Every Day Smoker    Types: Cigarettes  . Smokeless tobacco: Not on file  . Alcohol Use: No    OB History    Grav Para Term Preterm Abortions TAB SAB Ect Mult Living                  Review of Systems  Constitutional: Negative.   Respiratory: Negative.   Gastrointestinal: Negative.   Genitourinary: Positive for vaginal discharge and pelvic pain. Negative for dysuria, flank pain, vaginal bleeding, difficulty urinating, vaginal pain and menstrual problem.  Musculoskeletal: Negative.   Neurological: Negative.     Allergies  Review of patient's allergies indicates no known allergies.  Home Medications   Current Outpatient Rx  Name Route Sig Dispense Refill  . MEDROXYPROGESTERONE ACETATE 150 MG/ML IM SUSP Intramuscular Inject 150 mg into the muscle every 3 (three) months.    . AZITHROMYCIN 500 MG PO TABS  Take 2 tablets now 2 tablet 0  . METRONIDAZOLE 500 MG PO TABS Oral Take 1 tablet (500 mg total) by mouth 2 (two) times daily. X 7 days 14 tablet 0    BP 120/74  Pulse 94  Temp 98.8 F (37.1 C) (Oral)  Resp 20  SpO2 100%  Physical Exam  Constitutional: She is oriented to person, place, and  time. She appears well-developed and well-nourished. No distress.  Neck: Normal range of motion. Neck supple.  Abdominal: Soft. There is no tenderness.  Genitourinary:       Vagina with a small amount of thin white discharge. Cervix is posterior, os parous, no bleeding or discharge from the os. No cervical motion tenderness no adnexal tenderness  Neurological: She is alert and oriented to person, place, and time.  Skin: Skin is warm and dry.    ED Course  Procedures (including critical care time)  Labs Reviewed  POCT URINALYSIS DIP (DEVICE) - Abnormal; Notable for the following:    Ketones, ur TRACE (*)     Hgb urine dipstick TRACE (*)     All other components within normal limits  POCT PREGNANCY, URINE  WET PREP, GENITAL  GC/CHLAMYDIA PROBE AMP, GENITAL   No results found.   1. Possible exposure to STD   2. Bacterial vaginosis       MDM   Results for orders placed during the hospital encounter of 03/30/12  POCT URINALYSIS DIP (DEVICE)      Component Value Range   Glucose, UA NEGATIVE  NEGATIVE mg/dL   Bilirubin Urine NEGATIVE  NEGATIVE   Ketones, ur TRACE (*) NEGATIVE mg/dL   Specific Gravity, Urine 1.025  1.005 - 1.030   Hgb urine dipstick TRACE (*) NEGATIVE   pH 6.0  5.0 - 8.0   Protein, ur NEGATIVE  NEGATIVE mg/dL   Urobilinogen, UA 0.2  0.0 - 1.0 mg/dL   Nitrite NEGATIVE  NEGATIVE   Leukocytes, UA NEGATIVE  NEGATIVE  POCT PREGNANCY, URINE      Component Value Range   Preg Test, Ur NEGATIVE  NEGATIVE   Flagyl 500 bid x 7 d azithromzax 1 gm po per rx.         Hayden Rasmussen, NP 03/30/12 1958

## 2012-03-30 NOTE — ED Notes (Signed)
Reports she and her partner both had chlamydia 3-4 wks ago, and both underwent treatment, "but we had unprotected sex before the 7 days was up".  Partner was retreated 2 days ago.  Pt c/o "fishy smell" and vaginal discharge.  Now also having low abd and low back pain.  Denies fevers.

## 2012-03-31 LAB — GC/CHLAMYDIA PROBE AMP, GENITAL
Chlamydia, DNA Probe: NEGATIVE
GC Probe Amp, Genital: NEGATIVE

## 2012-03-31 NOTE — ED Provider Notes (Signed)
Medical screening examination/treatment/procedure(s) were performed by non-physician practitioner and as supervising physician I was immediately available for consultation/collaboration.  Ishita Mcnerney, M.D.   Cinsere Mizrahi C Teneisha Gignac, MD 03/31/12 0756 

## 2012-04-09 ENCOUNTER — Ambulatory Visit (INDEPENDENT_AMBULATORY_CARE_PROVIDER_SITE_OTHER): Payer: Medicaid Other | Admitting: Family Medicine

## 2012-04-09 ENCOUNTER — Other Ambulatory Visit (HOSPITAL_COMMUNITY)
Admission: RE | Admit: 2012-04-09 | Discharge: 2012-04-09 | Disposition: A | Discharge status: Home / Self Care | Payer: Medicaid Other | Source: Ambulatory Visit | Attending: Family Medicine | Admitting: Family Medicine

## 2012-04-09 ENCOUNTER — Telehealth: Payer: Self-pay | Admitting: Family Medicine

## 2012-04-09 ENCOUNTER — Encounter: Payer: Self-pay | Admitting: Family Medicine

## 2012-04-09 VITALS — BP 127/85 | HR 94 | Ht 63.0 in | Wt 194.0 lb

## 2012-04-09 DIAGNOSIS — N898 Other specified noninflammatory disorders of vagina: Secondary | ICD-10-CM

## 2012-04-09 DIAGNOSIS — Z113 Encounter for screening for infections with a predominantly sexual mode of transmission: Secondary | ICD-10-CM | POA: Insufficient documentation

## 2012-04-09 DIAGNOSIS — N760 Acute vaginitis: Secondary | ICD-10-CM

## 2012-04-09 LAB — POCT WET PREP (WET MOUNT): Trichomonas Wet Prep HPF POC: NEGATIVE

## 2012-04-09 MED ORDER — FLUCONAZOLE 150 MG PO TABS: 150.0000 mg | ORAL_TABLET | Freq: Once | ORAL | Status: AC

## 2012-04-09 NOTE — Patient Instructions (Signed)
It was nice to meet you.  It looks like you have a yeast infection, but we will look under the microscope to make sure.  If it is yeast, I will send in a medicine-- you take 1 pill today and then take the 2nd pill in 3 days (on Saturday if you take one today).  We will call and let you know if your chlamydia was positive.  Come back if you do not get better.

## 2012-04-09 NOTE — Assessment & Plan Note (Addendum)
Pt with thick white d/c. Will check wet prep and treat accordingly.  Also recheck GC/Chlam; treated 2 weeks ago, so chlamydia may still be + even if no reinfection, but if negative will show adequate treatment.   Addendum: + yeast on wet prep; diflucan 150 po now and in 72 hours sent to pharmacy.

## 2012-04-09 NOTE — Telephone Encounter (Signed)
Called and informed pt of + yeast on wet prep and that Rx has been sent to pharmacy.

## 2012-04-09 NOTE — Progress Notes (Signed)
S: Pt comes in today for "vaginal problem." Patient reports burning, itching, and vaginal irritation with a thick, white discharge without odor x3-4 days.  Pt was treated for yeast and chlamydia ~4-6 weeks ago, partner was also treated but they did not wait 7 days to have intercourse.  Pt's boyfriend was retested 2-3 weeks ago and was positive and retreated.  She went to UC and was seen 2.5 weeks ago, diagnosed with BV and + chlam, treated for both. Vaginal d/c had resolved ~1.5 weeks ago and restarted a few days ago. She had almost 1 week symptom free w/o d/c or irritation.    ROS: Per HPI  History  Smoking status  . Current Every Day Smoker  . Types: Cigarettes  Smokeless tobacco  . Not on file    O:  Filed Vitals:   04/09/12 1450  BP: 127/85  Pulse: 94    Gen: NAD Abd: soft, NT Pelvic: external genitalia normal but with thick, white d/c on labia; + thick, white, clumpy d/c in vagina; cervix normal w/o irritation or friability- no bleeding from wet prep or GC/chlam; no CMT, uterus and ovaries WNL on palpation    A/P: 26 y.o. female p/w vaginal d/c -See problem list -f/u PRN

## 2012-04-10 ENCOUNTER — Telehealth: Payer: Self-pay | Admitting: Family Medicine

## 2012-04-10 NOTE — Telephone Encounter (Signed)
Left message for patient to return call. Please see message from Dr McGill.Bodin Gorka Lee  

## 2012-04-10 NOTE — Telephone Encounter (Signed)
Please call pt and let her know her chlamydia and gonorrhea were NEGATIVE

## 2012-04-10 NOTE — Telephone Encounter (Signed)
Patient called back and was given the results from below.

## 2012-05-15 ENCOUNTER — Ambulatory Visit: Payer: Medicaid Other | Admitting: Family Medicine

## 2012-05-19 ENCOUNTER — Ambulatory Visit: Payer: Medicaid Other | Admitting: Family Medicine

## 2012-06-11 ENCOUNTER — Encounter: Payer: Self-pay | Admitting: Family Medicine

## 2012-06-11 ENCOUNTER — Ambulatory Visit (INDEPENDENT_AMBULATORY_CARE_PROVIDER_SITE_OTHER): Payer: Medicaid Other | Admitting: Family Medicine

## 2012-06-11 VITALS — BP 129/81 | HR 94 | Temp 99.4°F | Ht 63.0 in | Wt 194.0 lb

## 2012-06-11 DIAGNOSIS — R209 Unspecified disturbances of skin sensation: Secondary | ICD-10-CM

## 2012-06-11 DIAGNOSIS — R110 Nausea: Secondary | ICD-10-CM

## 2012-06-11 DIAGNOSIS — R200 Anesthesia of skin: Secondary | ICD-10-CM

## 2012-06-11 LAB — POCT URINE PREGNANCY: Preg Test, Ur: NEGATIVE

## 2012-06-12 DIAGNOSIS — R200 Anesthesia of skin: Secondary | ICD-10-CM | POA: Insufficient documentation

## 2012-06-12 NOTE — Patient Instructions (Addendum)
Drink plenty of fluids. Come back for reevaluation if nausea is still present in one week.  See Dr. Madolyn Frieze if you continue to have spells of right arm numbness.

## 2012-06-12 NOTE — Progress Notes (Signed)
  Subjective:    Patient ID: Sabrina Buckley, female    DOB: 1985/07/18, 26 y.o.   MRN: [id number]  HPI  Patient has two complaints during this SDA. 1. 2 months of intermitent Rt. Arm numbness.  Not related to specific activities.  No weakness.  No other numbness. 2. 24 hours of nausea, vomit x1 and diarrhea.  Infant son had vomiting about 1 week ago.  Irregular LMP with depo - she is overdue     Review of Systems     Objective:   Physical Exam Abd benign Rt upper ext, normal strength, sensation and reflexes.  Neg provacative testing for carpal tunnel       Assessment & Plan:

## 2012-06-12 NOTE — Assessment & Plan Note (Signed)
Intermittent with no obvious cause.  Observe and FU with PCP.

## 2012-06-12 NOTE — Assessment & Plan Note (Signed)
Likely viral GE.  Not pregnant.  Observe.

## 2012-06-18 ENCOUNTER — Ambulatory Visit (HOSPITAL_COMMUNITY)
Admission: RE | Admit: 2012-06-18 | Discharge: 2012-06-18 | Disposition: A | Discharge status: Home / Self Care | Payer: Medicaid Other | Source: Ambulatory Visit | Attending: Family Medicine | Admitting: Family Medicine

## 2012-06-18 ENCOUNTER — Ambulatory Visit (INDEPENDENT_AMBULATORY_CARE_PROVIDER_SITE_OTHER): Payer: Medicaid Other | Admitting: Family Medicine

## 2012-06-18 VITALS — BP 114/73 | HR 62 | Ht 63.0 in | Wt 194.0 lb

## 2012-06-18 DIAGNOSIS — R42 Dizziness and giddiness: Secondary | ICD-10-CM

## 2012-06-18 LAB — BASIC METABOLIC PANEL
CO2: 23 mEq/L (ref 19–32)
Calcium: 9.1 mg/dL (ref 8.4–10.5)
Sodium: 141 mEq/L (ref 135–145)

## 2012-06-18 LAB — CBC
MCH: 29 pg (ref 26.0–34.0)
Platelets: 301 10*3/uL (ref 150–400)
RBC: 4.35 MIL/uL (ref 3.87–5.11)
WBC: 7.2 10*3/uL (ref 4.0–10.5)

## 2012-06-18 NOTE — Progress Notes (Signed)
Subjective:     Patient ID: Sabrina Buckley, female   DOB: 1986/03/18, 26 y.o.   MRN: [id number]  HPI 1. Lightheadedness: She has been experiencing episodes of lightheadedness. It seems to flare up every so often. It started last year but resolved until about 2 weeks ago. She does not know what alleviated it last year. She has had two episodes in the past week and they last up to 45 minutes. She doesn't have any initiating factors.  They occur regardless of position.  She states when they do occur she get foggy and everything seems in a distance. She states that she is under much stress due to a new relationship and doesn't get much sleep at night.  She doesn't exercise and only drinks 3 glasses of water a day.  She denies any family history of thyroid, heart disease or sudden cardiac death.   She has an episode last night around midnight. She says she went to the bathroom to pour some medication for her son and then felt foggy and fell down and hit her head on the bathtub. She did not lose her bowels or urinate on herself. She denies headache. She did not have symptoms when she stood up from her bed.     Review of Systems Per HPI  She endorses feelings of depression and increased stress past 5 months especially when she started relationship with boyfriend  She is mother to 47 and 67 year old children. She sleep about 4-5 hours a night on average and endorses feeling fatigued She denies nausea and vomiting  She denies headache or hearing changes  Denies alcohol, cocaine, marijuana, or other drugs     Objective:   Physical Exam General: NAD,  Neuro: finger to nose test are negative, negative [place] sign, normal nonantalgic gait, able to stay balanced walking toe to heel  HEENT: no goiter felt, no lymphadenopathy, EOMI, NCAT. 2x2 Bump on left upper forehead where she hit the bathtub.   Cardio: S1S2, no murmurs, RRR Resp: CTA, no rhonchi, rales, or wheezes  Extremities: +2 pulses in upper  and lower extremities  ECG: normal sinus rhythm  Rate: regular Rhythm: regular Axis: normal left-axis Intervals:    PR (normal 0.12-0.20): normal P-wave (normal <0.12 duration, 0.25 mv amplitude): normal QRS complex (normal 0.06-0.10): normal  no pathologic Q waves, signs of BBB, or hypertrophy ST-T waves: no elevated or depressed   Patient seen with the assistance of MS-IV Clare Gandy.     Assessment:        Plan:

## 2012-06-18 NOTE — Assessment & Plan Note (Addendum)
She reports 45 minute episodes of lightheadedness that started last year which had resolved but returned 2 weeks ago.  D/Dx:  -Dehydration: negative orthostatics, not tachycardic, but she only drinks about 3 glasses of water daily and her MM were slightly dry. I do not think this is only cause but advised she stay hydrated to prevent this from contributing to lightheadedness.  -Thyroid disease. We will check TSH.  -Glucose abnormality. Unlikely with normal random glucose of 110.  -Arrhythmia. ECG today is normal but this is still possibility. She endorses occasional palpitations and rare momentary episodes of chest pain that are not associated with lightheaded episodes. No family history of cardiac disease. If her symptoms persist or become more severe, consider Holter monitor.  -Depression/anxiety, stress. She does endorse symptoms, and this may be contributing. We discussed briefly about this possibility.  Follow-up in 1 week to go over lab results and if negative, delve more into potential psychiatric component.

## 2012-06-18 NOTE — Patient Instructions (Addendum)
We will discuss your lab results at your next visit In the meantime, try drinking more liquids during the day 6-8 glasses daily  Follow-up in 1 week or sooner if you have worsening symptoms Go to the ED if the clinic is not open and you have significant symptoms

## 2012-06-19 LAB — T4, FREE: Free T4: 1.12 ng/dL (ref 0.80–1.80)

## 2012-06-25 ENCOUNTER — Encounter: Payer: Self-pay | Admitting: Family Medicine

## 2012-06-25 ENCOUNTER — Ambulatory Visit (INDEPENDENT_AMBULATORY_CARE_PROVIDER_SITE_OTHER): Payer: Medicaid Other | Admitting: Family Medicine

## 2012-06-25 VITALS — BP 131/86 | HR 76 | Temp 98.5°F | Ht 63.0 in | Wt 196.0 lb

## 2012-06-25 DIAGNOSIS — R8789 Other abnormal findings in specimens from female genital organs: Secondary | ICD-10-CM

## 2012-06-25 DIAGNOSIS — R42 Dizziness and giddiness: Secondary | ICD-10-CM

## 2012-06-25 NOTE — Progress Notes (Signed)
  Subjective:    Patient ID: Sabrina Buckley, female    DOB: [birth year], 26 y.o.   MRN: [id number]  HPI # Follow-up of dizziness and to go over lab work Basic lab work including thyroid test normal  She had 1 "small" episode of dizziness since her last visit 12/11; no falls.  She has been trying to stay better hydrated and she feels like this is helping.   She denies being particularly stressed, however, she is in a relatively new relationship. It upsets her when her boyfriend tells her "she looks mad" when she is not. She copes by taking a few minutes to herself in her room. She has 2 sons 5 and 10. She also works. She is looking forward to holidays. She enjoys spending time with children, but sometimes feels guilty she cannot purchase the items they request (like an iPod).   Review of Systems Denies palpitations, chest pain, dyspnea Denies SI/HI  Allergies, medication, past medical history reviewed.      Objective:   Physical Exam GEN: NAD PSYCH: pleasant, calm appearing, not anxious or depressed  PHQ-9: 6/30 somewhat difficult    Assessment & Plan:

## 2012-06-25 NOTE — Assessment & Plan Note (Signed)
We discussed lab results that did not show significant abnormalities.  Her dizziness has improved. Hydration may have helped.  I do not think she is depressed, however, she seems to sometimes feel overwhelmed with normal life stressors (relationship, children). We discussed finding ways to take time for herself to help cope.  She will follow-up as needed if symptoms worsen.

## 2012-08-17 ENCOUNTER — Emergency Department (INDEPENDENT_AMBULATORY_CARE_PROVIDER_SITE_OTHER)
Admission: EM | Admit: 2012-08-17 | Discharge: 2012-08-17 | Disposition: A | Discharge status: Home / Self Care | Payer: Self-pay | Source: Home / Self Care

## 2012-08-17 ENCOUNTER — Encounter (HOSPITAL_COMMUNITY): Payer: Self-pay | Admitting: *Deleted

## 2012-08-17 DIAGNOSIS — Z5189 Encounter for other specified aftercare: Secondary | ICD-10-CM

## 2012-08-17 DIAGNOSIS — N764 Abscess of vulva: Secondary | ICD-10-CM

## 2012-08-17 MED ORDER — HYDROCODONE-ACETAMINOPHEN 7.5-325 MG PO TABS: 1.0000 | ORAL_TABLET | Freq: Four times a day (QID) | ORAL | Status: DC | PRN

## 2012-08-17 MED ORDER — SULFAMETHOXAZOLE-TRIMETHOPRIM 800-160 MG PO TABS: 1.0000 | ORAL_TABLET | Freq: Two times a day (BID) | ORAL | Status: DC

## 2012-08-17 NOTE — ED Notes (Signed)
Patient complains of painful boil to right of vagina x 3 days. Denies fever/chills.

## 2012-08-17 NOTE — ED Provider Notes (Signed)
History     CSN: [id number]  Arrival date & time 08/17/12  1351   First MD Initiated Contact with Patient 08/17/12 1409      Chief Complaint  Patient presents with  . Recurrent Skin Infections    (Consider location/radiation/quality/duration/timing/severity/associated sxs/prior treatment) HPI  Past Medical History  Diagnosis Date  . Chlamydia     Past Surgical History  Procedure Laterality Date  . Hernia repair      No family history on file.  History  Substance Use Topics  . Smoking status: Current Every Day Smoker    Types: Cigarettes  . Smokeless tobacco: Not on file  . Alcohol Use: No    OB History   Grav Para Term Preterm Abortions TAB SAB Ect Mult Living                  Review of Systems  Allergies  Review of patient's allergies indicates no known allergies.  Home Medications   Current Outpatient Rx  Name  Route  Sig  Dispense  Refill  . HYDROcodone-acetaminophen (NORCO) 7.5-325 MG per tablet   Oral   Take 1 tablet by mouth every 6 (six) hours as needed for pain.   15 tablet   0   . medroxyPROGESTERone (DEPO-PROVERA) 150 MG/ML injection   Intramuscular   Inject 150 mg into the muscle every 3 (three) months.         . sulfamethoxazole-trimethoprim (SEPTRA DS) 800-160 MG per tablet   Oral   Take 1 tablet by mouth 2 (two) times daily. X 7 days   14 tablet   0     BP 127/80  Pulse 64  Temp(Src) 99.7 F (37.6 C) (Oral)  Resp 16  SpO2 99%  Physical Exam  ED Course  INCISION AND DRAINAGE Date/Time: 08/17/2012 4:32 PM Performed by: Phineas Real, Yoshito Gaza Authorized by: Bradd Canary D Consent: Verbal consent obtained. Risks and benefits: risks, benefits and alternatives were discussed Consent given by: patient Patient understanding: patient states understanding of the procedure being performed Patient identity confirmed: verbally with patient Type: abscess Body area: anogenital Location details: vulva Anesthesia: local infiltration Local  anesthetic: lidocaine 2% with epinephrine Anesthetic total: 9 ml Patient sedated: no Scalpel size: 11 Incision type: single with marsupialization Complexity: complex Drainage: purulent and bloody Drainage amount: moderate Wound treatment: drain placed Packing material: 1/4 in gauze Patient tolerance: Patient tolerated the procedure well with no immediate complications. Comments: Dressed with gauze   (including critical care time)  Labs Reviewed - No data to display No results found.   1. Abscess of labia majora       MDM  The abscess was incised and drained Packed with quarter-inch Nu Gauze Covered with sterile dressing Septra DS one twice a day for 7 day Norco 7.5 every 4-6 hours as needed for pain #15 To return to the urgent care 2 days for wound check and packing removal.        Hayden Rasmussen, NP 08/17/12 1637

## 2012-08-18 NOTE — ED Notes (Signed)
Message on answering machine , pt asking for work note. Per D Mabe, FNP, pt to return as directed in AM, and will determine number of days of work excuse note; pt verbalized understanding of plan

## 2012-08-19 ENCOUNTER — Emergency Department (INDEPENDENT_AMBULATORY_CARE_PROVIDER_SITE_OTHER)
Admission: EM | Admit: 2012-08-19 | Discharge: 2012-08-19 | Disposition: A | Discharge status: Home / Self Care | Payer: Self-pay | Source: Home / Self Care

## 2012-08-19 ENCOUNTER — Encounter (HOSPITAL_COMMUNITY): Payer: Self-pay | Admitting: Emergency Medicine

## 2012-08-19 DIAGNOSIS — N764 Abscess of vulva: Secondary | ICD-10-CM

## 2012-08-19 DIAGNOSIS — Z5189 Encounter for other specified aftercare: Secondary | ICD-10-CM

## 2012-08-19 MED ORDER — DOXYCYCLINE HYCLATE 100 MG PO CAPS: 100.0000 mg | ORAL_CAPSULE | Freq: Two times a day (BID) | ORAL | Status: DC

## 2012-08-19 NOTE — ED Notes (Signed)
Pt here for packing removal lower right pelvic area. Pt states having some drainage and still having some tenderness. Pt has no other concerns.

## 2012-08-19 NOTE — ED Notes (Signed)
Patient triaged by kim lapan-hutchens, rn

## 2012-08-19 NOTE — ED Provider Notes (Signed)
History     CSN: [id number]  Arrival date & time 08/19/12  1437   First MD Initiated Contact with Patient 08/19/12 1453      Chief Complaint  Patient presents with  . Follow-up    packing removal    (Consider location/radiation/quality/duration/timing/severity/associated sxs/prior treatment) HPI Comments: This patient returned 2 days after her abscess on the right labia majora was incised and drained. The packing was removed. There remains induration with the same area size but there is no further drainage. She states there is continuous tenderness and discomfort. No fevers chills or other constitutional symptoms.   Past Medical History  Diagnosis Date  . Chlamydia     Past Surgical History  Procedure Laterality Date  . Hernia repair      History reviewed. No pertinent family history.  History  Substance Use Topics  . Smoking status: Current Every Day Smoker    Types: Cigarettes  . Smokeless tobacco: Not on file  . Alcohol Use: No    OB History   Grav Para Term Preterm Abortions TAB SAB Ect Mult Living                  Review of Systems  All other systems reviewed and are negative.    Allergies  Review of patient's allergies indicates no known allergies.  Home Medications   Current Outpatient Rx  Name  Route  Sig  Dispense  Refill  . sulfamethoxazole-trimethoprim (SEPTRA DS) 800-160 MG per tablet   Oral   Take 1 tablet by mouth 2 (two) times daily. X 7 days   14 tablet   0   . doxycycline (VIBRAMYCIN) 100 MG capsule   Oral   Take 1 capsule (100 mg total) by mouth 2 (two) times daily.   14 capsule   0   . HYDROcodone-acetaminophen (NORCO) 7.5-325 MG per tablet   Oral   Take 1 tablet by mouth every 6 (six) hours as needed for pain.   15 tablet   0   . medroxyPROGESTERone (DEPO-PROVERA) 150 MG/ML injection   Intramuscular   Inject 150 mg into the muscle every 3 (three) months.           BP 132/83  Pulse 86  Temp(Src) 98 F (36.7 C)  (Oral)  Resp 18  SpO2 100%  Physical Exam  Constitutional: She appears well-developed and well-nourished. No distress.  Cardiovascular: Normal rate.   Pulmonary/Chest: Effort normal. No respiratory distress.  Neurological: She is alert. She exhibits normal muscle tone.  Skin: Skin is warm and dry.  Induration of the right labia majora remains. The cavity is red but the no drainage or bleeding. She does feel some better however. No overlying erythema or lymphangitis.  Psychiatric: She has a normal mood and affect.    ED Course  Wound repair Date/Time: 08/19/2012 5:07 PM Performed by: Phineas Real, Laurieann Friddle Authorized by: Bradd Canary D Consent: Verbal consent obtained. Risks and benefits: risks, benefits and alternatives were discussed Consent given by: patient Patient identity confirmed: verbally with patient Local anesthesia used: no Patient sedated: no Comments: The packing was removed. An additional 5 cm of 1/4 inch iodoform gauze was reinserted into the wound. Dressing was applied   (including critical care time)  Labs Reviewed - No data to display No results found.   1. Abscess, vulva   2. Encounter for wound care       MDM  After the above procedure a dressing was applied. Since there continues to be  unchanged induration and culture results are showing gram-negative rods only we will treat with an additional antibiotic for 5 days. Doxycycline 100 mg twice a day for 5 days. He may pull the packing out and 2 days. If worse new symptoms problems or not improving please return to         Hayden Rasmussen, NP 08/19/12 1709

## 2012-08-19 NOTE — ED Provider Notes (Signed)
Medical screening examination/treatment/procedure(s) were performed by resident physician or non-physician practitioner and as supervising physician I was immediately available for consultation/collaboration.   Barkley Bruns MD.   Linna Hoff, MD 08/19/12 1113

## 2012-08-19 NOTE — ED Provider Notes (Signed)
Medical screening examination/treatment/procedure(s) were performed by resident physician or non-physician practitioner and as supervising physician I was immediately available for consultation/collaboration.   Camreigh Michie DOUGLAS MD.   Lajoy Vanamburg D Mialynn Shelvin, MD 08/19/12 2046 

## 2012-08-20 LAB — CULTURE, ROUTINE-ABSCESS: Gram Stain: NONE SEEN

## 2012-08-21 NOTE — ED Notes (Addendum)
Abscess culture R labia: Mod. Proteus Mirabilis.  Pt. adequately treated with Septra on 2/9 and also got Doxycycline on 2/11.  Doxycycline is not on the sensitivity report. Vassie Moselle 08/21/2012 Hayden Rasmussen wrote on 2/18, no changes, that it was OK for pt. to finish both antibiotics. Vassie Moselle 08/28/2012

## 2012-10-08 ENCOUNTER — Ambulatory Visit (INDEPENDENT_AMBULATORY_CARE_PROVIDER_SITE_OTHER): Payer: Medicaid Other | Admitting: Family Medicine

## 2012-10-08 ENCOUNTER — Encounter: Payer: Self-pay | Admitting: Family Medicine

## 2012-10-08 VITALS — BP 119/80 | HR 87 | Temp 98.2°F | Ht 63.0 in | Wt 202.1 lb

## 2012-10-08 DIAGNOSIS — IMO0001 Reserved for inherently not codable concepts without codable children: Secondary | ICD-10-CM

## 2012-10-08 DIAGNOSIS — Z309 Encounter for contraceptive management, unspecified: Secondary | ICD-10-CM

## 2012-10-08 MED ORDER — ETONOGESTREL-ETHINYL ESTRADIOL 0.12-0.015 MG/24HR VA RING: VAGINAL_RING | VAGINAL | Status: DC

## 2012-10-08 NOTE — Patient Instructions (Signed)
Nuvaring for 3 weeks, then remove. You should have a period. Then re-insert after 1 week.

## 2012-10-08 NOTE — Assessment & Plan Note (Signed)
Try Nuvaring.  Encouraged smoking cessation. She is not ready to quit yet.  Follow-up in 1 year or sooner if needed.

## 2012-10-08 NOTE — Progress Notes (Signed)
  Subjective:    Patient ID: Sabrina Buckley, female    DOB: Apr 24, 1986, 27 y.o.   MRN: [id number]  HPI # Birth control She was last on Depo 01/21/2012.  She wants to have regular periods and wants to be on birth control that does allow this. She does desire pregnancy at this time.   Review of Systems Denies vaginal discharge, irritation   Allergies, medication, past medical history reviewed.  Smoking status noted. She is trying to cut back on smoking. 6 cigarettes a day. Stress is a big trigger.      Objective:   Physical Exam GEN: NAD; obese ABD: soft, NT, obese    Assessment & Plan:

## 2012-10-30 ENCOUNTER — Ambulatory Visit (INDEPENDENT_AMBULATORY_CARE_PROVIDER_SITE_OTHER): Payer: PRIVATE HEALTH INSURANCE | Admitting: Family Medicine

## 2012-10-30 ENCOUNTER — Encounter: Payer: Self-pay | Admitting: Family Medicine

## 2012-10-30 VITALS — BP 141/76 | HR 77 | Temp 98.4°F | Resp 20 | Ht 63.0 in | Wt 205.0 lb

## 2012-10-30 DIAGNOSIS — B3731 Acute candidiasis of vulva and vagina: Secondary | ICD-10-CM | POA: Insufficient documentation

## 2012-10-30 DIAGNOSIS — B373 Candidiasis of vulva and vagina: Secondary | ICD-10-CM

## 2012-10-30 DIAGNOSIS — N898 Other specified noninflammatory disorders of vagina: Secondary | ICD-10-CM

## 2012-10-30 LAB — POCT WET PREP (WET MOUNT): Clue Cells Wet Prep Whiff POC: NEGATIVE

## 2012-10-30 LAB — POCT URINALYSIS DIPSTICK
Blood, UA: NEGATIVE
Ketones, UA: NEGATIVE
Protein, UA: NEGATIVE
Spec Grav, UA: >=1.03
pH, UA: 6.5

## 2012-10-30 MED ORDER — TERCONAZOLE 0.4 % VA CREA: 1.0000 | TOPICAL_CREAM | Freq: Every day | VAGINAL | Status: DC

## 2012-10-30 NOTE — Addendum Note (Signed)
Addended byFeliz Beam on: 10/30/2012 03:22 PM   Modules accepted: Orders

## 2012-10-30 NOTE — Assessment & Plan Note (Addendum)
Vaginal discharge: Candida vaginitis      Wet prep positive for many yeast. UA clean. Start Terazol 7, I escribed her med.

## 2012-10-30 NOTE — Progress Notes (Addendum)
Subjective:     Patient ID: Sabrina Buckley, female   DOB: 03-Jun-1986, 27 y.o.   MRN: [id number]  Vaginal Discharge The patient's primary symptoms include a vaginal discharge. The patient's pertinent negatives include no genital itching, genital lesions or genital rash. This is a new (This started after she was placed on penicillin A/B after dental work) problem. The problem occurs constantly. The problem has been gradually worsening. The patient is experiencing no pain. Pertinent negatives include no discolored urine, dysuria, fever, flank pain, nausea, urgency or vomiting. The vaginal discharge was thick and white. There has been no bleeding. Nothing aggravates the symptoms. She has tried nothing for the symptoms. She is not sexually active. No, her partner does not have an STD. She uses condoms for contraception.   Past Medical History  Diagnosis Date  . Chlamydia       Review of Systems  Constitutional: Negative for fever.  Respiratory: Negative.   Cardiovascular: Negative.   Gastrointestinal: Negative for nausea and vomiting.  Genitourinary: Positive for vaginal discharge. Negative for dysuria, urgency and flank pain.  All other systems reviewed and are negative.   Filed Vitals:   10/30/12 1419  BP: 141/76  Pulse: 77  Temp: 98.4 F (36.9 C)  TempSrc: Oral  Resp: 20  Height: 5\' 3"  (1.6 m)  Weight: 205 lb (92.987 kg)  SpO2: 100%        Objective:   Physical Exam  Nursing note and vitals reviewed. Constitutional: She is oriented to person, place, and time. She appears well-developed. No distress.  Cardiovascular: Normal rate, regular rhythm, normal heart sounds and intact distal pulses.   No murmur heard. Pulmonary/Chest: Effort normal and breath sounds normal. No respiratory distress. She has no wheezes.  Abdominal: Soft. Bowel sounds are normal. She exhibits no distension and no mass. There is no tenderness.  Genitourinary: Uterus normal. No labial fusion. There  is no rash, tenderness or lesion on the right labia. There is no rash, tenderness or lesion on the left labia. Cervix exhibits discharge. Cervix exhibits no motion tenderness. Vaginal discharge found.  Musculoskeletal: Normal range of motion. She exhibits no edema.  Neurological: She is alert and oriented to person, place, and time.       Assessment:     Vaginal discharge: Candida vaginitis     Plan:     Wet prep positive for many yeast. UA clean. Start Terazol 7, I escribed her med.

## 2012-12-22 ENCOUNTER — Telehealth: Payer: Self-pay | Admitting: Family Medicine

## 2012-12-22 NOTE — Telephone Encounter (Signed)
Patient has some questions about the Nuvaring.  She has removed one possible too early and isn't sure of the symptoms she is having, spotting, periods, nipple tenderness.

## 2012-12-22 NOTE — Telephone Encounter (Signed)
Pt reports removing ring last week in April with period starting May 2 , not sure when she inserted new one but removed on the 25th of May. Currently does not have one in and states that it is too early to get another. Negative home pregnancy test. Assured pt that symptoms were normal due to body adjusting to fluctuating hormones and that back up protection should be used for the next 6 weeks. Advised pt to start using ring again on July 1 - leave in for 3 weeks remove for 1 week and insert a new ring at the end of the week. Pt stated " Oh I didn't know how that worked" pt verbalized understanding and will use back up protection for the next 6 weeks. Wyatt Haste, RN-BSN

## 2012-12-31 ENCOUNTER — Other Ambulatory Visit (HOSPITAL_COMMUNITY)
Admission: RE | Admit: 2012-12-31 | Discharge: 2012-12-31 | Disposition: A | Discharge status: Home / Self Care | Payer: PRIVATE HEALTH INSURANCE | Source: Ambulatory Visit | Attending: Family Medicine | Admitting: Family Medicine

## 2012-12-31 ENCOUNTER — Ambulatory Visit (INDEPENDENT_AMBULATORY_CARE_PROVIDER_SITE_OTHER): Payer: PRIVATE HEALTH INSURANCE | Admitting: Family Medicine

## 2012-12-31 VITALS — BP 116/78 | HR 76 | Ht 63.0 in | Wt 201.0 lb

## 2012-12-31 DIAGNOSIS — N898 Other specified noninflammatory disorders of vagina: Secondary | ICD-10-CM

## 2012-12-31 DIAGNOSIS — B9689 Other specified bacterial agents as the cause of diseases classified elsewhere: Secondary | ICD-10-CM | POA: Insufficient documentation

## 2012-12-31 DIAGNOSIS — A499 Bacterial infection, unspecified: Secondary | ICD-10-CM

## 2012-12-31 DIAGNOSIS — N760 Acute vaginitis: Secondary | ICD-10-CM | POA: Insufficient documentation

## 2012-12-31 DIAGNOSIS — Z113 Encounter for screening for infections with a predominantly sexual mode of transmission: Secondary | ICD-10-CM | POA: Insufficient documentation

## 2012-12-31 DIAGNOSIS — Z7251 High risk heterosexual behavior: Secondary | ICD-10-CM

## 2012-12-31 LAB — POCT WET PREP (WET MOUNT): Clue Cells Wet Prep Whiff POC: POSITIVE

## 2012-12-31 LAB — POCT URINE PREGNANCY: Preg Test, Ur: NEGATIVE

## 2012-12-31 MED ORDER — METRONIDAZOLE 500 MG PO TABS: 500.0000 mg | ORAL_TABLET | Freq: Two times a day (BID) | ORAL | Status: DC

## 2012-12-31 NOTE — Patient Instructions (Addendum)
It was nice to meet you today.  Your not pregnant-- make sure you use condoms if you are having sex without the NuvaRing in! Otherwise, you will end up pregnant!  Your vaginal discharge is because of bacterial vaginosis, or BV.  This is not a sexually transmitted disease.  Take the medicine 2 times per day for 7 days. Do not drink alcohol while taking the medicine.   It will take a few days for the other results to come back-- we will call if you need to be treated for anything else.  Come back as needed.

## 2012-12-31 NOTE — Progress Notes (Signed)
S: Pt comes in today for SDA for vaginal irritation and d/c.  Patient reports that she has daily d/c, but this different.  It changed over the weekend (4 days ago).  Having burning with washing and itching.  Has not changed soaps or washes.  Has not tried any OTC medications.  Discharge is white and thick.  Feels similar to previous yeast infection, which is usually cleared up by po diflucan.  No recent antibiotics.  Has nuvaring, LMP was in May, has not had ring in since 5/25.  Is currently sexually active, are not using condoms or any other form of protection.  Does have a new partner and would like to be tested for STIs.    ROS: Per HPI  History  Smoking status  . Current Every Day Smoker  . Types: Cigarettes  Smokeless tobacco  . Not on file    O:  Filed Vitals:   12/31/12 0840  BP: 116/78  Pulse: 76    Gen: NAD Abd: soft, NT Pelvic: normal external genitalia, vaginal mucosa normal, + moderate amount of thick white d/c, cervix normal in appearance, no CMT, no adnexal fullness or tenderness   A/P: 27 y.o. female p/w vaginal d/c -See problem list -f/u in PRN

## 2013-03-03 ENCOUNTER — Ambulatory Visit (INDEPENDENT_AMBULATORY_CARE_PROVIDER_SITE_OTHER): Payer: 59 | Admitting: Emergency Medicine

## 2013-03-03 ENCOUNTER — Encounter: Payer: Self-pay | Admitting: Family Medicine

## 2013-03-03 VITALS — BP 107/71 | HR 70 | Temp 98.6°F | Ht 63.0 in | Wt 205.0 lb

## 2013-03-03 DIAGNOSIS — B373 Candidiasis of vulva and vagina: Secondary | ICD-10-CM

## 2013-03-03 DIAGNOSIS — IMO0001 Reserved for inherently not codable concepts without codable children: Secondary | ICD-10-CM

## 2013-03-03 DIAGNOSIS — Z309 Encounter for contraceptive management, unspecified: Secondary | ICD-10-CM

## 2013-03-03 DIAGNOSIS — N898 Other specified noninflammatory disorders of vagina: Secondary | ICD-10-CM

## 2013-03-03 LAB — POCT WET PREP (WET MOUNT): Clue Cells Wet Prep Whiff POC: NEGATIVE

## 2013-03-03 MED ORDER — FLUCONAZOLE 150 MG PO TABS: 150.0000 mg | ORAL_TABLET | Freq: Once | ORAL | Status: DC

## 2013-03-03 MED ORDER — NORETHINDRONE ACET-ETHINYL EST 1-20 MG-MCG PO TABS: 1.0000 | ORAL_TABLET | Freq: Every day | ORAL | Status: DC

## 2013-03-03 NOTE — Assessment & Plan Note (Signed)
NuvaRing may be contributing to repeat BV. Discussed with patient who desires to change to OCPs. She was on Loestrin in the past without difficulty. Prescription for Loestrin sent to pharmacy and instructions given.

## 2013-03-03 NOTE — Progress Notes (Signed)
  Subjective:    Patient ID: Sabrina Buckley, female    DOB: 01-30-1986, 27 y.o.   MRN: [id number]  HPI Patient presents for evaluation of suspected yeast infection. Complains of   Review of Systems     Objective:   Physical Exam        Assessment & Plan:

## 2013-03-03 NOTE — Assessment & Plan Note (Addendum)
History and exam consistent with yeast infection. Wet prep collected today. Will treat with Diflucan 150mg  PO x1. If symptoms do not resolve, patient to call the office for repeat dose of Diflucan.  I will call the patient if wet prep shows additional findings.

## 2013-03-03 NOTE — Patient Instructions (Signed)
It was nice to meet you!  I think you have a yeast infection after the antibiotics.  The recurrent bacterial infections may be from the NuvaRing.  I have sent a prescription for birth control pills to your pharmacy.  You can start taking them any time or the Sunday after your next period.  Please call the office if the discharge does not go away after taking the medicine (Diflucan).

## 2013-03-03 NOTE — Progress Notes (Signed)
  Subjective:    Patient ID: Sabrina Buckley, female    DOB: 02-Jan-1986, 27 y.o.   MRN: [id number]  HPI Sabrina Buckley is here for a SDA for vaginal discharge.  She was seen about 10 days ago for vaginal discharge and diagnosed with BV.  She completed the course of Flagyl and now is noticing a different discharge.  She thinks it might be a yeast infection as it is white and clumpy.  Also with vaginal itching and some burning with washing.  No odor.  No pelvic pain, fevers, nausea, vomiting.  Denies new sexual partners.  She does state that she has had multiple episodes of BV in the last few months, ever since starting the NuvaRing for birth control.  I have reviewed and updated the following as appropriate: allergies and current medications SHx: current smoker  Review of Systems See HPI    Objective:   Physical Exam BP 107/71  Pulse 70  Temp(Src) 98.6 F (37 C) (Oral)  Ht 5\' 3"  (1.6 m)  Wt 205 lb (92.987 kg)  BMI 36.32 kg/m2 Gen: alert, cooperative, NAD Pelvic: normal external genitalia; vagina normal; dirty white clumpy discharge noted      Assessment & Plan:

## 2013-07-23 ENCOUNTER — Ambulatory Visit (INDEPENDENT_AMBULATORY_CARE_PROVIDER_SITE_OTHER): Payer: 59 | Admitting: Family Medicine

## 2013-07-23 ENCOUNTER — Encounter: Payer: Self-pay | Admitting: Family Medicine

## 2013-07-23 VITALS — BP 137/75 | HR 99 | Temp 98.2°F | Ht 63.0 in | Wt 201.0 lb

## 2013-07-23 DIAGNOSIS — M722 Plantar fascial fibromatosis: Secondary | ICD-10-CM

## 2013-07-23 NOTE — Assessment & Plan Note (Signed)
-   exam and hx most consistent with plantar fasciitis - discussed conservative measures with ice, NSAIDs, and exercises - handout given  - discussed natural time course of healing.  - f/u if no improvement after 1 month.

## 2013-07-23 NOTE — Patient Instructions (Signed)
Plantar Fasciitis (Heel Spur Syndrome) with Rehab The plantar fascia is a fibrous, ligament-like, soft-tissue structure that spans the bottom of the foot. Plantar fasciitis is a condition that causes pain in the foot due to inflammation of the tissue. SYMPTOMS   Pain and tenderness on the underneath side of the foot.  Pain that worsens with standing or walking. CAUSES  Plantar fasciitis is caused by irritation and injury to the plantar fascia on the underneath side of the foot. Common mechanisms of injury include:  Direct trauma to bottom of the foot.  Damage to a small nerve that runs under the foot where the main fascia attaches to the heel bone.  Stress placed on the plantar fascia due to bone spurs. RISK INCREASES WITH:   Activities that place stress on the plantar fascia (running, jumping, pivoting, or cutting).  Poor strength and flexibility.  Improperly fitted shoes.  Tight calf muscles.  Flat feet.  Failure to warm-up properly before activity.  Obesity. PREVENTION  Warm up and stretch properly before activity.  Allow for adequate recovery between workouts.  Maintain physical fitness:  Strength, flexibility, and endurance.  Cardiovascular fitness.  Maintain a health body weight.  Avoid stress on the plantar fascia.  Wear properly fitted shoes, including arch supports for individuals who have flat feet. PROGNOSIS  If treated properly, then the symptoms of plantar fasciitis usually resolve without surgery. However, occasionally surgery is necessary. RELATED COMPLICATIONS   Recurrent symptoms that may result in a chronic condition.  Problems of the lower back that are caused by compensating for the injury, such as limping.  Pain or weakness of the foot during push-off following surgery.  Chronic inflammation, scarring, and partial or complete fascia tear, occurring more often from repeated injections. TREATMENT  Treatment initially involves the use of  ice and medication to help reduce pain and inflammation. The use of strengthening and stretching exercises may help reduce pain with activity, especially stretches of the Achilles tendon. These exercises may be performed at home or with a therapist. Your caregiver may recommend that you use heel cups of arch supports to help reduce stress on the plantar fascia. Occasionally, corticosteroid injections are given to reduce inflammation. If symptoms persist for greater than 6 months despite non-surgical (conservative), then surgery may be recommended.  MEDICATION   If pain medication is necessary, then nonsteroidal anti-inflammatory medications, such as aspirin and ibuprofen, or other minor pain relievers, such as acetaminophen, are often recommended.  Do not take pain medication within 7 days before surgery.  Prescription pain relievers may be given if deemed necessary by your caregiver. Use only as directed and only as much as you need.  Corticosteroid injections may be given by your caregiver. These injections should be reserved for the most serious cases, because they may only be given a certain number of times. HEAT AND COLD  Cold treatment (icing) relieves pain and reduces inflammation. Cold treatment should be applied for 10 to 15 minutes every 2 to 3 hours for inflammation and pain and immediately after any activity that aggravates your symptoms. Use ice packs or massage the area with a piece of ice (ice massage).  Heat treatment may be used prior to performing the stretching and strengthening activities prescribed by your caregiver, physical therapist, or athletic trainer. Use a heat pack or soak the injury in warm water. SEEK IMMEDIATE MEDICAL CARE IF:  Treatment seems to offer no benefit, or the condition worsens.  Any medications produce adverse side effects. EXERCISES RANGE   OF MOTION (ROM) AND STRETCHING EXERCISES - Plantar Fasciitis (Heel Spur Syndrome) These exercises may help you  when beginning to rehabilitate your injury. Your symptoms may resolve with or without further involvement from your physician, physical therapist or athletic trainer. While completing these exercises, remember:   Restoring tissue flexibility helps normal motion to return to the joints. This allows healthier, less painful movement and activity.  An effective stretch should be held for at least 30 seconds.  A stretch should never be painful. You should only feel a gentle lengthening or release in the stretched tissue. RANGE OF MOTION - Toe Extension, Flexion  Sit with your right / left leg crossed over your opposite knee.  Grasp your toes and gently pull them back toward the top of your foot. You should feel a stretch on the bottom of your toes and/or foot.  Hold this stretch for __________ seconds.  Now, gently pull your toes toward the bottom of your foot. You should feel a stretch on the top of your toes and or foot.  Hold this stretch for __________ seconds. Repeat __________ times. Complete this stretch __________ times per day.  RANGE OF MOTION - Ankle Dorsiflexion, Active Assisted  Remove shoes and sit on a chair that is preferably not on a carpeted surface.  Place right / left foot under knee. Extend your opposite leg for support.  Keeping your heel down, slide your right / left foot back toward the chair until you feel a stretch at your ankle or calf. If you do not feel a stretch, slide your bottom forward to the edge of the chair, while still keeping your heel down.  Hold this stretch for __________ seconds. Repeat __________ times. Complete this stretch __________ times per day.  STRETCH  Gastroc, Standing  Place hands on wall.  Extend right / left leg, keeping the front knee somewhat bent.  Slightly point your toes inward on your back foot.  Keeping your right / left heel on the floor and your knee straight, shift your weight toward the wall, not allowing your back to  arch.  You should feel a gentle stretch in the right / left calf. Hold this position for __________ seconds. Repeat __________ times. Complete this stretch __________ times per day. STRETCH  Soleus, Standing  Place hands on wall.  Extend right / left leg, keeping the other knee somewhat bent.  Slightly point your toes inward on your back foot.  Keep your right / left heel on the floor, bend your back knee, and slightly shift your weight over the back leg so that you feel a gentle stretch deep in your back calf.  Hold this position for __________ seconds. Repeat __________ times. Complete this stretch __________ times per day. STRETCH  Gastrocsoleus, Standing  Note: This exercise can place a lot of stress on your foot and ankle. Please complete this exercise only if specifically instructed by your caregiver.   Place the ball of your right / left foot on a step, keeping your other foot firmly on the same step.  Hold on to the wall or a rail for balance.  Slowly lift your other foot, allowing your body weight to press your heel down over the edge of the step.  You should feel a stretch in your right / left calf.  Hold this position for __________ seconds.  Repeat this exercise with a slight bend in your right / left knee. Repeat __________ times. Complete this stretch __________ times per day.    STRENGTHENING EXERCISES - Plantar Fasciitis (Heel Spur Syndrome)  These exercises may help you when beginning to rehabilitate your injury. They may resolve your symptoms with or without further involvement from your physician, physical therapist or athletic trainer. While completing these exercises, remember:   Muscles can gain both the endurance and the strength needed for everyday activities through controlled exercises.  Complete these exercises as instructed by your physician, physical therapist or athletic trainer. Progress the resistance and repetitions only as guided. STRENGTH - Towel  Curls  Sit in a chair positioned on a non-carpeted surface.  Place your foot on a towel, keeping your heel on the floor.  Pull the towel toward your heel by only curling your toes. Keep your heel on the floor.  If instructed by your physician, physical therapist or athletic trainer, add ____________________ at the end of the towel. Repeat __________ times. Complete this exercise __________ times per day. STRENGTH - Ankle Inversion  Secure one end of a rubber exercise band/tubing to a fixed object (table, pole). Loop the other end around your foot just before your toes.  Place your fists between your knees. This will focus your strengthening at your ankle.  Slowly, pull your big toe up and in, making sure the band/tubing is positioned to resist the entire motion.  Hold this position for __________ seconds.  Have your muscles resist the band/tubing as it slowly pulls your foot back to the starting position. Repeat __________ times. Complete this exercises __________ times per day.  Document Released: 06/25/2005 Document Revised: 09/17/2011 Document Reviewed: 10/07/2008 [employer] Patient Information 2014 [employer]. Plantar Fasciitis Plantar fasciitis is a common condition that causes foot pain. It is soreness (inflammation) of the band of tough fibrous tissue on the bottom of the foot that runs from the heel bone (calcaneus) to the ball of the foot. The cause of this soreness may be from excessive standing, poor fitting shoes, running on hard surfaces, being overweight, having an abnormal walk, or overuse (this is common in runners) of the painful foot or feet. It is also common in aerobic exercise dancers and ballet dancers. SYMPTOMS  Most people with plantar fasciitis complain of:  Severe pain in the morning on the bottom of their foot especially when taking the first steps out of bed. This pain recedes after a few minutes of walking.  Severe pain is experienced also during walking  following a long period of inactivity.  Pain is worse when walking barefoot or up stairs DIAGNOSIS   Your caregiver will diagnose this condition by examining and feeling your foot.  Special tests such as X-rays of your foot, are usually not needed. PREVENTION   Consult a sports medicine professional before beginning a new exercise program.  Walking programs offer a good workout. With walking there is a lower chance of overuse injuries common to runners. There is less impact and less jarring of the joints.  Begin all new exercise programs slowly. If problems or pain develop, decrease the amount of time or distance until you are at a comfortable level.  Wear good shoes and replace them regularly.  Stretch your foot and the heel cords at the back of the ankle (Achilles tendon) both before and after exercise.  Run or exercise on even surfaces that are not hard. For example, asphalt is better than pavement.  Do not run barefoot on hard surfaces.  If using a treadmill, vary the incline.  Do not continue to workout if you have foot or joint   problems. Seek professional help if they do not improve. HOME CARE INSTRUCTIONS   Avoid activities that cause you pain until you recover.  Use ice or cold packs on the problem or painful areas after working out.  Only take over-the-counter or prescription medicines for pain, discomfort, or fever as directed by your caregiver.  Soft shoe inserts or athletic shoes with air or gel sole cushions may be helpful.  If problems continue or become more severe, consult a sports medicine caregiver or your own health care provider. Cortisone is a potent anti-inflammatory medication that may be injected into the painful area. You can discuss this treatment with your caregiver. MAKE SURE YOU:   Understand these instructions.  Will watch your condition.  Will get help right away if you are not doing well or get worse. Document Released: 03/20/2001 Document  Revised: 09/17/2011 Document Reviewed: 05/19/2008 [employer] Patient Information 2014 [employer].  

## 2013-07-23 NOTE — Progress Notes (Signed)
Patient ID: Sabrina Buckley, female   DOB: 07/30/1985, 28 y.o.   MRN: [id number] FAMILY MEDICINE OFFICE NOTE  Chief Complaint:  Left foot pain  Primary Care Physician: Rodman PickleHAIRFORD, AMBER, MD  HPI:  Sabrina Buckley here for left foot pain  - started 1 week ago Limped all day on Thursday  Over the weekend seemed to get better Then when she went back to work on Monday, the pain worsened again - on Wednesday, noticed some swelling on the medial aspect of her foot - tried ice and it helped some. Tried aleve but didn't help  - sharp pain. Worse with standing and walking.  - constantly on her feet at work  - worse first few steps in the AM also - pain mostly in her heel  Never had it before.   No fevers, weakness, color changes, nail changes, numbness.   PMHx:  Past Medical History  Diagnosis Date  . Chlamydia     Past Surgical History  Procedure Laterality Date  . Hernia repair      FAMHx:  No family history on file.  SOCHx:   reports that she has been smoking Cigarettes.  She has been smoking about 0.60 packs per day. She does not have any smokeless tobacco history on file. She reports that she does not drink alcohol or use illicit drugs.  ALLERGIES:  No Known Allergies  ROS: Pertinent ROS as seen in HPI. Otherwise negative.   HOME MEDS: Current Outpatient Prescriptions  Medication Sig Dispense Refill  . fluconazole (DIFLUCAN) 150 MG tablet Take 1 tablet (150 mg total) by mouth once.  1 tablet  0  . norethindrone-ethinyl estradiol (MICROGESTIN,JUNEL,LOESTRIN) 1-20 MG-MCG tablet Take 1 tablet by mouth daily.  1 Package  11   No current facility-administered medications for this visit.    LABS/IMAGING: No results found for this or any previous visit (from the past 48 hour(s)). No results found.  VITALS: BP 137/75  Pulse 99  Temp(Src) 98.2 F (36.8 C) (Oral)  Ht 5\' 3"  (1.6 m)  Wt 201 lb (91.173 kg)  BMI 35.61 kg/m2  EXAM: Gen: NAD, well  appearing EXT: 2+ DP pulses, no edema Left Foot- no obvious deformity or inflammation. Tender to palpation at the base of the calcaneous at the insertion of the fascia. Tender to the medial aspect of the plantar surface. Full ROM. Tenderness worse with resisted toe dorsiflexion and resisted dorsiflexion.    ASSESSMENT: Plantar fasciitis of left foot  PLAN: See assessment and plan

## 2013-08-19 ENCOUNTER — Ambulatory Visit (INDEPENDENT_AMBULATORY_CARE_PROVIDER_SITE_OTHER): Payer: 59 | Admitting: Family Medicine

## 2013-08-19 ENCOUNTER — Encounter: Payer: Self-pay | Admitting: Family Medicine

## 2013-08-19 ENCOUNTER — Other Ambulatory Visit (HOSPITAL_COMMUNITY)
Admission: RE | Admit: 2013-08-19 | Discharge: 2013-08-19 | Disposition: A | Discharge status: Home / Self Care | Payer: 59 | Source: Ambulatory Visit | Attending: Family Medicine | Admitting: Family Medicine

## 2013-08-19 VITALS — BP 120/81 | HR 85 | Temp 98.3°F | Ht 63.0 in | Wt 206.0 lb

## 2013-08-19 DIAGNOSIS — N898 Other specified noninflammatory disorders of vagina: Secondary | ICD-10-CM

## 2013-08-19 DIAGNOSIS — Z20828 Contact with and (suspected) exposure to other viral communicable diseases: Secondary | ICD-10-CM

## 2013-08-19 DIAGNOSIS — B373 Candidiasis of vulva and vagina: Secondary | ICD-10-CM

## 2013-08-19 DIAGNOSIS — Z01419 Encounter for gynecological examination (general) (routine) without abnormal findings: Secondary | ICD-10-CM | POA: Insufficient documentation

## 2013-08-19 DIAGNOSIS — Z113 Encounter for screening for infections with a predominantly sexual mode of transmission: Secondary | ICD-10-CM | POA: Insufficient documentation

## 2013-08-19 DIAGNOSIS — R8789 Other abnormal findings in specimens from female genital organs: Secondary | ICD-10-CM

## 2013-08-19 DIAGNOSIS — B3731 Acute candidiasis of vulva and vagina: Secondary | ICD-10-CM

## 2013-08-19 DIAGNOSIS — Z124 Encounter for screening for malignant neoplasm of cervix: Secondary | ICD-10-CM

## 2013-08-19 LAB — POCT WET PREP (WET MOUNT): CLUE CELLS WET PREP WHIFF POC: NEGATIVE

## 2013-08-19 MED ORDER — FLUCONAZOLE 150 MG PO TABS: 150.0000 mg | ORAL_TABLET | Freq: Once | ORAL | Status: DC

## 2013-08-19 NOTE — Patient Instructions (Signed)
It was good to meet you.  I have sent in a prescription for you.  I will call you if any of your labs are abnormal, no news is good news! I will also send you a letter with your pap results.  Let me know if you need anything! Chadric Kimberley M. Marisabel Macpherson, M.D.

## 2013-08-20 LAB — HIV ANTIBODY (ROUTINE TESTING W REFLEX): HIV: NONREACTIVE

## 2013-08-20 LAB — RPR

## 2013-08-20 NOTE — Assessment & Plan Note (Signed)
Pap collected again today. F/u results.

## 2013-08-20 NOTE — Progress Notes (Signed)
Patient ID: Sabrina Buckley, female   DOB: 03/28/1986, 28 y.o.   MRN: [id number]    Subjective: HPI: Patient is a 28 y.o. female presenting to clinic today for pap smear.  Subjective:     Sabrina Buckley is a 28 y.o. woman who comes in today for a  pap smear only. Her most recent annual exam was on August 2013. Her most recent Pap smear was on same day and showed no abnormalities. Previous abnormal Pap smears: yes - LSIL with 2 cervical biopsies in the past. Contraception: OCP (estrogen/progesterone)  Concerned about vaginal discharge. Treated for BV in the past. Had fishy odor and discharge a few weeks after bleeding, which resolved. But now having different discharge than usual. Has a new sexual partner and would ike to be checked for STD. LMP last week. No dysuria, pelvic pain, abnormal bleeding or pain with intercourse.  The following portions of the patient's history were reviewed and updated as appropriate: allergies, current medications, past family history, past medical history, past social history, past surgical history and problem list.  Review of Systems Pertinent items are noted in HPI.   Objective:    BP 120/81  Pulse 85  Temp(Src) 98.3 F (36.8 C) (Oral)  Ht 5\' 3"  (1.6 m)  Wt 206 lb (93.441 kg)  BMI 36.50 kg/m2  LMP 08/09/2013 Pelvic Exam: cervix normal in appearance, external genitalia normal, no adnexal masses or tenderness, no cervical motion tenderness and vagina normal without discharge. Pap smear obtained.   Assessment:    Screening pap smear.  Vaginal discharge Possible exposure to STD  Plan:    Follow up in 1 year, or as indicated by Pap results.  Wet prep shows yeast, will treat with Diflucan GC/CH, HIV and RPR checked as well

## 2013-08-20 NOTE — Assessment & Plan Note (Signed)
Wet prep shows yeast, treat with Diflucan. Other test results still pending. F/u prn

## 2013-08-21 ENCOUNTER — Encounter: Payer: Self-pay | Admitting: Family Medicine

## 2013-10-15 ENCOUNTER — Ambulatory Visit (INDEPENDENT_AMBULATORY_CARE_PROVIDER_SITE_OTHER): Payer: 59 | Admitting: Family Medicine

## 2013-10-15 ENCOUNTER — Encounter: Payer: Self-pay | Admitting: Family Medicine

## 2013-10-15 VITALS — BP 120/87 | HR 85 | Temp 97.8°F | Wt 210.0 lb

## 2013-10-15 DIAGNOSIS — M25539 Pain in unspecified wrist: Secondary | ICD-10-CM | POA: Insufficient documentation

## 2013-10-15 MED ORDER — WRIST/THUMB SPLINT/LEFT MEDIUM MISC: 1.0000 | Status: DC | PRN

## 2013-10-15 MED ORDER — DICLOFENAC SODIUM 75 MG PO TBEC: 75.0000 mg | DELAYED_RELEASE_TABLET | Freq: Two times a day (BID) | ORAL | Status: DC

## 2013-10-15 NOTE — Patient Instructions (Signed)
De Quervain's Disease De Quervain's disease is a condition often seen in racquet sports where there is a soreness (inflammation) in the cord like structures (tendons) which attach muscle to bone on the thumb side of the wrist. There may be a tightening of the tissuesaround the tendons. This condition is often helped by giving up or modifying the activity which caused it. When conservative treatment does not help, surgery may be required. Conservative treatment could include changes in the activity which brought about the problem or made it worse. Anti-inflammatory medications and injections may be used to help decrease the inflammation and help with pain control. Your caregiver will help you determine which is best for you. DIAGNOSIS  Often the diagnosis (learning what is wrong) can be made by examination. Sometimes x-rays are required. HOME CARE INSTRUCTIONS   Apply ice to the sore area for 15-20 minutes, 03-04 times per day while awake. Put the ice in a plastic bag and place a towel between the bag of ice and your skin. This is especially helpful if it can be done after all activities involving the sore wrist.  Temporary splinting may help.  Only take over-the-counter or prescription medicines for pain, discomfort or fever as directed by your caregiver. SEEK MEDICAL CARE IF:   Pain relief is not obtained with medications, or if you have increasing pain and seem to be getting worse rather than better. MAKE SURE YOU:   Understand these instructions.  Will watch your condition.  Will get help right away if you are not doing well or get worse. Document Released: 03/20/2001 Document Revised: 09/17/2011 Document Reviewed: 06/25/2005 Exit[employer] Patient Information 2014 [employer].  

## 2013-10-15 NOTE — Assessment & Plan Note (Signed)
A: History and exam most consistent with tenosynovitis. No injury to area. DDx includes bone problem, carpal tunnel, or wrist strain.  P: - X-ray today to evaluate bones, most likely going to be normal - Diclofenac BID - Thumb spica - RTC in 2 weeks, if not improved, consider steroid injection. Patient agrees.

## 2013-10-15 NOTE — Progress Notes (Signed)
Patient ID: Sabrina Buckley, female   DOB: 05/31/1986, 28 y.o.   MRN: [id number]    Subjective: HPI: Patient is a 28 y.o. female presenting to clinic today for same day appointment for wrist pain.  Wrist Pain Patient complains of left wrist pain.  The pain is moderate, worsens with movement, and some relief by rest.  There is not associated numbness, tingling in the left hand.  Pain has been present for 1  week.  There is not a history of injury, strain, or overuse. She does carry her heavy purse on her left wrist. She denies redness or swelling but she has felt a knot on the radial side of wrist.  History Reviewed: Daily smoker. Health Maintenance: UTD  ROS: Please see HPI above.  Objective: Office vital signs reviewed. BP 120/87  Pulse 85  Temp(Src) 97.8 F (36.6 C) (Oral)  Wt 210 lb (95.255 kg)  LMP 09/29/2013  Physical Examination:  General: Awake, alert. NAD HEENT: Atraumatic, normocephalic Extremities: LEFT WRIST: No edema or erythema. TTP of base of thumb down radial side of forearm. Neg tinnels. Positive Finkelstein.  Neuro: Grossly intact, slight discomfort with grip on the left.  Assessment: 28 y.o. female with wrist pain   Plan: See Problem List and After Visit Summary

## 2014-02-12 ENCOUNTER — Encounter: Payer: Self-pay | Admitting: Family Medicine

## 2014-02-12 ENCOUNTER — Other Ambulatory Visit (HOSPITAL_COMMUNITY)
Admission: RE | Admit: 2014-02-12 | Discharge: 2014-02-12 | Disposition: A | Discharge status: Home / Self Care | Payer: 59 | Source: Ambulatory Visit | Attending: Family Medicine | Admitting: Family Medicine

## 2014-02-12 ENCOUNTER — Ambulatory Visit (INDEPENDENT_AMBULATORY_CARE_PROVIDER_SITE_OTHER): Payer: 59 | Admitting: Family Medicine

## 2014-02-12 VITALS — BP 125/80 | HR 77 | Temp 98.3°F | Ht 63.0 in | Wt 205.7 lb

## 2014-02-12 DIAGNOSIS — B3731 Acute candidiasis of vulva and vagina: Secondary | ICD-10-CM

## 2014-02-12 DIAGNOSIS — B373 Candidiasis of vulva and vagina: Secondary | ICD-10-CM

## 2014-02-12 DIAGNOSIS — N760 Acute vaginitis: Secondary | ICD-10-CM

## 2014-02-12 DIAGNOSIS — B9689 Other specified bacterial agents as the cause of diseases classified elsewhere: Secondary | ICD-10-CM

## 2014-02-12 DIAGNOSIS — Z113 Encounter for screening for infections with a predominantly sexual mode of transmission: Secondary | ICD-10-CM | POA: Diagnosis present

## 2014-02-12 DIAGNOSIS — N898 Other specified noninflammatory disorders of vagina: Secondary | ICD-10-CM

## 2014-02-12 DIAGNOSIS — A499 Bacterial infection, unspecified: Secondary | ICD-10-CM

## 2014-02-12 LAB — POCT WET PREP (WET MOUNT): CLUE CELLS WET PREP WHIFF POC: POSITIVE

## 2014-02-12 LAB — POCT URINE PREGNANCY: PREG TEST UR: NEGATIVE

## 2014-02-12 MED ORDER — HYDROXYZINE PAMOATE 25 MG PO CAPS: 25.0000 mg | ORAL_CAPSULE | ORAL | Status: DC | PRN

## 2014-02-12 MED ORDER — PERMETHRIN 5 % EX CREA: 1.0000 "application " | TOPICAL_CREAM | Freq: Once | CUTANEOUS | Status: DC

## 2014-02-12 MED ORDER — METRONIDAZOLE 500 MG PO TABS: 500.0000 mg | ORAL_TABLET | Freq: Two times a day (BID) | ORAL | Status: DC

## 2014-02-12 NOTE — Progress Notes (Signed)
   Subjective:    Patient ID: Sabrina Buckley, female    DOB: 03/07/1986, 28 y.o.   MRN: [id number]  HPI Comments: [name] comes in today for evaluation of vaginal odor times one day.   She had sexual intercourse on Sunday without protection, then began noticing an abnormal vaginal odor and discharge yesterday.  She denies any vaginal pain or itching. She reports some vaginal bleeding, but says she is ending her mental cycle this morning.  She reports noncompliance with her birth control and does not use condoms.  Reports remote history of STDs and yeast infections.  She denies any fevers, chills, back pain, or dysuria.  Review of Systems See HPI     Objective:   Physical Exam  Constitutional: She appears well-developed and well-nourished.  Genitourinary:  Speculum Exam: Ext genitalia: wnl; Vaginal discharge: thick white; Cervix: wnl Bimanual Exam: No Cervical motion tenderness; No Vaginal wall defects; Adnexa nontender    Assessment/Plan:      See Problem Focused Assessment & Plan

## 2014-02-12 NOTE — Assessment & Plan Note (Signed)
Wet prep + for yeast - Diflucan given in clinic

## 2014-02-12 NOTE — Assessment & Plan Note (Signed)
Wet prep + for BV - Metronidazole x 7 days

## 2014-03-18 ENCOUNTER — Ambulatory Visit: Payer: 59 | Admitting: Family Medicine

## 2014-04-01 ENCOUNTER — Encounter: Payer: Self-pay | Admitting: Family Medicine

## 2014-04-01 ENCOUNTER — Ambulatory Visit (INDEPENDENT_AMBULATORY_CARE_PROVIDER_SITE_OTHER): Payer: 59 | Admitting: Family Medicine

## 2014-04-01 VITALS — BP 123/74 | HR 73 | Temp 98.4°F | Ht 63.0 in | Wt 203.0 lb

## 2014-04-01 DIAGNOSIS — B373 Candidiasis of vulva and vagina: Secondary | ICD-10-CM

## 2014-04-01 DIAGNOSIS — R300 Dysuria: Secondary | ICD-10-CM

## 2014-04-01 DIAGNOSIS — A499 Bacterial infection, unspecified: Secondary | ICD-10-CM

## 2014-04-01 DIAGNOSIS — N760 Acute vaginitis: Secondary | ICD-10-CM

## 2014-04-01 DIAGNOSIS — B9689 Other specified bacterial agents as the cause of diseases classified elsewhere: Secondary | ICD-10-CM

## 2014-04-01 DIAGNOSIS — B3731 Acute candidiasis of vulva and vagina: Secondary | ICD-10-CM

## 2014-04-01 LAB — POCT URINALYSIS DIPSTICK
BILIRUBIN UA: NEGATIVE
Glucose, UA: NEGATIVE
Ketones, UA: NEGATIVE
LEUKOCYTES UA: NEGATIVE
NITRITE UA: NEGATIVE
PH UA: 6.5
PROTEIN UA: NEGATIVE
RBC UA: NEGATIVE
Spec Grav, UA: 1.02
UROBILINOGEN UA: 1

## 2014-04-01 MED ORDER — METRONIDAZOLE 500 MG PO TABS: 500.0000 mg | ORAL_TABLET | Freq: Two times a day (BID) | ORAL | Status: DC

## 2014-04-01 MED ORDER — FLUCONAZOLE 150 MG PO TABS: 150.0000 mg | ORAL_TABLET | Freq: Once | ORAL | Status: DC

## 2014-04-01 NOTE — Progress Notes (Signed)
Patient ID: Sabrina Buckley, female   DOB: [birth year], 28 y.o.   MRN: [id number]   Subjective:    Patient ID: Sabrina Buckley, female    DOB: 08-21-1985, 28 y.o.   MRN: [id number]  HPI  Patient presents for Same Day Appointment  CC: vaginal discharge  # Vaginal discharge:  Started after her last period several days ago. Has had similar symptoms 1 month ago, gets frequent yeast infections  Discharge is white, clumpy, having itchy as well  Having a little burning with urination, burning is when stream starts and goes away ROS: no fevers/chills, no abdominal pain, no flank pain  Review of Systems   See HPI for ROS. All other systems reviewed and are negative.  Past medical history, surgical, family, and social history reviewed and updated in the EMR as appropriate.  Objective:  BP 123/74  Pulse 73  Temp(Src) 98.4 F (36.9 C) (Oral)  Ht  (1.6 m)  Wt 203 lb (92.08 kg)  BMI 35.97 kg/m2  LMP 03/09/2014 Vitals reviewed  General: NAD CV: RRR, normal s1 and s2, no murmurs Resp: CTAB, normal effort Abdomen: soft, nontender, nondistended GU: deferred  Assessment & Plan:  See Problem List Documentation

## 2014-04-01 NOTE — Assessment & Plan Note (Signed)
No wet prep done today in office, however patient history consistent with yeast and possible recurrent BV/yeast and will elect to treat empirically. UA was negative for evidence of UTI. Diflucan now, flagyl course, repeat diflucan after. If symptoms do not resolve, pt to RTC for wet prep. 

## 2014-04-01 NOTE — Assessment & Plan Note (Signed)
No wet prep done today in office, however patient history consistent with yeast and possible recurrent BV/yeast and will elect to treat empirically. UA was negative for evidence of UTI. Diflucan now, flagyl course, repeat diflucan after. If symptoms do not resolve, pt to RTC for wet prep.

## 2014-05-24 ENCOUNTER — Encounter: Payer: Self-pay | Admitting: Family Medicine

## 2014-05-24 ENCOUNTER — Ambulatory Visit (INDEPENDENT_AMBULATORY_CARE_PROVIDER_SITE_OTHER): Payer: 59 | Admitting: Family Medicine

## 2014-05-24 ENCOUNTER — Other Ambulatory Visit (HOSPITAL_COMMUNITY)
Admission: RE | Admit: 2014-05-24 | Discharge: 2014-05-24 | Disposition: A | Discharge status: Home / Self Care | Payer: 59 | Source: Ambulatory Visit | Attending: Family Medicine | Admitting: Family Medicine

## 2014-05-24 VITALS — BP 122/69 | HR 86 | Temp 98.9°F | Resp 18 | Wt 202.0 lb

## 2014-05-24 DIAGNOSIS — Z113 Encounter for screening for infections with a predominantly sexual mode of transmission: Secondary | ICD-10-CM | POA: Insufficient documentation

## 2014-05-24 DIAGNOSIS — N898 Other specified noninflammatory disorders of vagina: Secondary | ICD-10-CM

## 2014-05-24 LAB — POCT WET PREP (WET MOUNT): Clue Cells Wet Prep Whiff POC: POSITIVE

## 2014-05-24 MED ORDER — METRONIDAZOLE 500 MG PO TABS
500.0000 mg | ORAL_TABLET | Freq: Two times a day (BID) | ORAL | Status: DC
Start: 2014-05-24 — End: 2015-12-06

## 2014-05-24 MED ORDER — FLUCONAZOLE 150 MG PO TABS: 150.0000 mg | ORAL_TABLET | Freq: Once | ORAL | Status: DC

## 2014-05-24 NOTE — Assessment & Plan Note (Signed)
Wet prep with moderate clue cells and yeast. GC/Chlamydia also obtained today.  Patient with RPR and HIV earlier this year. Will treat with Flagyl and Diflucan. Refills given as patient has this often.

## 2014-05-24 NOTE — Progress Notes (Signed)
   Subjective:    Patient ID: Sabrina Buckley, female    DOB: 06/27/1986, 28 y.o.   MRN: [id number]  CC: Vaginal Discharge  HPI 28 year old female presents for a same day appt with complaints of vaginal discharge.  1) Vaginal discharge  Onset: Began 5 days ago   Description: Thick, white  Odor: Yes; Fishy odor    Itching: Yes, but now resolved  Recent antibiotic use: No  Exposure/Concern for STD: Patient desires testing today; She is currently sexually active w/ one female partner and does not use protection.  ROS:  Fever: no  Dysuria: no  Bleeding: no  Missed period: no  Back pain: no  Pelvic pain: no  Genital sores: no  Rash: no  Review of Systems Per HPI    Objective:   Physical Exam Filed Vitals:   05/24/14 1550  BP: 122/69  Pulse: 86  Temp: 98.9 F (37.2 C)  Resp: 18   Exam: General: well appearing, NAD. Pelvic Exam:        External: normal female genitalia without lesions or masses        Vagina: large amount of white discharge noted.        Cervix: normal without lesions or masses        Samples for Wet prep, GC/Chlamydia obtained    Assessment & Plan:  See Problem List

## 2014-05-25 LAB — CERVICOVAGINAL ANCILLARY ONLY
CHLAMYDIA, DNA PROBE: NEGATIVE
NEISSERIA GONORRHEA: NEGATIVE

## 2014-09-03 ENCOUNTER — Ambulatory Visit (INDEPENDENT_AMBULATORY_CARE_PROVIDER_SITE_OTHER): Payer: BLUE CROSS/BLUE SHIELD | Admitting: Family Medicine

## 2014-09-03 VITALS — BP 145/80 | HR 78 | Temp 98.2°F | Ht 63.0 in | Wt 194.0 lb

## 2014-09-03 DIAGNOSIS — N61 Inflammatory disorders of breast: Secondary | ICD-10-CM

## 2014-09-03 DIAGNOSIS — N611 Abscess of the breast and nipple: Secondary | ICD-10-CM | POA: Insufficient documentation

## 2014-09-03 MED ORDER — DOXYCYCLINE HYCLATE 100 MG PO TABS: 100.0000 mg | ORAL_TABLET | Freq: Two times a day (BID) | ORAL | Status: DC

## 2014-09-03 MED ORDER — FLUCONAZOLE 150 MG PO TABS: 150.0000 mg | ORAL_TABLET | Freq: Once | ORAL | Status: DC

## 2014-09-03 NOTE — Progress Notes (Signed)
   Subjective:    Patient ID: Sabrina Buckley, female    DOB: 02/15/1986, 29 y.o.   MRN: [id number]  HPI 29 year old female presents for a same day appointment for evaluation of a "sore spot" on her right breast.  Patient reports that 2 days ago she noticed a tender "bump" on her right breast.  No reported erythema.  No drainage.  No relieving factors. No interventions tried.  Family Hx - No hx of breast cancer.  Review of Systems Per HPI    Objective:   Physical Exam Filed Vitals:   09/03/14 1553  BP: 145/80  Pulse: 78  Temp: 98.2 F (36.8 C)  Exam: General: well appearing, NAD. Breast: R breast: superficial resolving pustule noted at the 8 o'clock position.  Small area of underlying induration.  Mild surrounding erythema. No drainage.  Mildly tender to palpation.     Assessment & Plan:  See Problem List

## 2014-09-03 NOTE — Assessment & Plan Note (Signed)
Given lack of fluctuance on exam, I elected to not perform I & D today. Treating with Doxycycline x 7 days.

## 2015-02-10 ENCOUNTER — Ambulatory Visit (INDEPENDENT_AMBULATORY_CARE_PROVIDER_SITE_OTHER): Payer: Self-pay | Admitting: Family Medicine

## 2015-02-10 ENCOUNTER — Encounter: Payer: Self-pay | Admitting: Family Medicine

## 2015-02-10 DIAGNOSIS — M222X2 Patellofemoral disorders, left knee: Secondary | ICD-10-CM

## 2015-02-10 DIAGNOSIS — M222X1 Patellofemoral disorders, right knee: Secondary | ICD-10-CM

## 2015-02-10 NOTE — Progress Notes (Signed)
    Subjective   Sabrina Buckley is a 29 y.o. female that presents for a same day visit  1. Knee pain, bilateral: Chronic and intermittent. Symptoms started about 2 years ago and occur infrequently. Pain is described as achy in her knees with associated swelling. Last episode occurred 5 days ago with bilateral symptoms that improved after one day. She reports recently walking up and down steps for her job. She has noticed her symptoms are worse in the winter. No history of knee injuries. She does not participate in sports. No catching, locking or instability.  ROS Per HPI  History  Substance Use Topics  . Smoking status: Current Every Day Smoker -- 0.60 packs/day    Types: Cigarettes  . Smokeless tobacco: Not on file  . Alcohol Use: No    No Known Allergies  Objective   BP 123/72 mmHg  Pulse 82  Temp(Src) 98.6 F (37 C) (Oral)  Ht  (1.6 m)  Wt 196 lb 4.8 oz (89.041 kg)  BMI 34.78 kg/m2  LMP 01/24/2015  General: Well appearing Musculoskeletal:  Knee (Right):  Normal to inspection with no erythema or effusion or obvious bony abnormalities.  No obvious Baker's cysts Palpation normal with no warmth or joint line tenderness or patellar tenderness or condyle tenderness. ROM normal in flexion (135 degrees) and extension (0 degrees) and lower leg rotation with crepitus on flexion Ligaments with solid consistent endpoints including ACL, PCL, LCL, MCL.  Negative Anterior Drawer/Lachman Negative Mcmurray's Non painful patellar compression.  Normal Patellar glide.  No apprehension  Hamstring and quadriceps strength is normal.  Knee (Left):  Normal to inspection with no erythema or effusion or obvious bony abnormalities.  No obvious Baker's cysts Palpation normal with no warmth or joint line tenderness or patellar tenderness or condyle tenderness. ROM normal in flexion (135 degrees) and extension (0 degrees) and lower leg rotation with crepitus on flexion Ligaments with  solid consistent endpoints including ACL, PCL, LCL, MCL.  Negative Anterior Drawer/Lachman Negative Mcmurray's Non painful patellar compression.  Normal Patellar glide.  No apprehension  Hamstring and quadriceps strength is normal.  Assessment and Plan   No orders of the defined types were placed in this encounter.    Patellofemoral pain syndrome  Patient given handout for exercises and stretching  Return for follow-up if not improving in a month; may need plain films

## 2015-02-10 NOTE — Patient Instructions (Signed)
Thank you for coming to see me today. It was a pleasure. Today we talked about:   Patellofemoral syndrome: I am going to provide you with exercises to perform. This should help with your pain. We can think about getting x-rays if symptoms do not improve.  If you have any questions or concerns, please do not hesitate to call the office at [phone].  Sincerely,  Jacquelin Hawking, MD

## 2015-12-06 ENCOUNTER — Ambulatory Visit (INDEPENDENT_AMBULATORY_CARE_PROVIDER_SITE_OTHER): Payer: Medicaid Other | Admitting: Family Medicine

## 2015-12-06 ENCOUNTER — Encounter: Payer: Self-pay | Admitting: Family Medicine

## 2015-12-06 ENCOUNTER — Telehealth: Payer: Self-pay | Admitting: Family Medicine

## 2015-12-06 ENCOUNTER — Other Ambulatory Visit (HOSPITAL_COMMUNITY)
Admission: RE | Admit: 2015-12-06 | Discharge: 2015-12-06 | Disposition: A | Discharge status: Home / Self Care | Payer: Medicaid Other | Source: Ambulatory Visit | Attending: Family Medicine | Admitting: Family Medicine

## 2015-12-06 VITALS — BP 118/69 | HR 76 | Temp 98.3°F | Ht 63.0 in | Wt 193.0 lb

## 2015-12-06 DIAGNOSIS — N912 Amenorrhea, unspecified: Secondary | ICD-10-CM | POA: Diagnosis not present

## 2015-12-06 DIAGNOSIS — Z720 Tobacco use: Secondary | ICD-10-CM

## 2015-12-06 DIAGNOSIS — N898 Other specified noninflammatory disorders of vagina: Secondary | ICD-10-CM | POA: Diagnosis not present

## 2015-12-06 DIAGNOSIS — Z202 Contact with and (suspected) exposure to infections with a predominantly sexual mode of transmission: Secondary | ICD-10-CM | POA: Diagnosis not present

## 2015-12-06 DIAGNOSIS — Z716 Tobacco abuse counseling: Secondary | ICD-10-CM

## 2015-12-06 DIAGNOSIS — Z113 Encounter for screening for infections with a predominantly sexual mode of transmission: Secondary | ICD-10-CM | POA: Insufficient documentation

## 2015-12-06 LAB — POCT WET PREP (WET MOUNT): CLUE CELLS WET PREP WHIFF POC: POSITIVE

## 2015-12-06 LAB — HIV ANTIBODY (ROUTINE TESTING W REFLEX): HIV 1&2 Ab, 4th Generation: NONREACTIVE

## 2015-12-06 LAB — POCT URINE PREGNANCY: Preg Test, Ur: POSITIVE — AB

## 2015-12-06 MED ORDER — METRONIDAZOLE 0.75 % VA GEL: 1.0000 | Freq: Every day | VAGINAL | Status: AC

## 2015-12-06 MED ORDER — METRONIDAZOLE 500 MG PO TABS: 500.0000 mg | ORAL_TABLET | Freq: Two times a day (BID) | ORAL | Status: DC

## 2015-12-06 MED ORDER — FLUCONAZOLE 150 MG PO TABS: 150.0000 mg | ORAL_TABLET | Freq: Once | ORAL | Status: DC

## 2015-12-06 NOTE — Progress Notes (Signed)
Subjective:     Patient ID: Sabrina Buckley, female   DOB: 12/26/1985, 30 y.o.   MRN: [id number]  HPI Amenorrhea/Vaginal discharge: [id number] with missed period. LMP 10/25/15.  She was using condoms on and off. Last OCP was more than a year. Not planning to get pregnant. But she will like to keep her pregnancy. No vaginal bleeding, but has vaginal discharge, she has been with her new partner for 6 months. Denies any burning or itching. No fever. She had two positive test at home. She endorsed breast soreness otherwise no other concern. Smoking:Patient endorsed smoking hx for about 10 yrs or more. She smokes about 1/2-1 PPD. She will like to quit now that she is pregnant. Not sure if she wants pharmacologic measures but she can try it.   No current outpatient prescriptions on file prior to visit.   No current facility-administered medications on file prior to visit.   Past Medical History  Diagnosis Date  . Chlamydia      Review of Systems  Respiratory: Negative.   Cardiovascular: Negative.   Gastrointestinal: Negative for nausea, vomiting, abdominal pain, diarrhea, constipation, abdominal distention and rectal pain.  Genitourinary: Positive for vaginal discharge. Negative for vaginal bleeding and vaginal pain.  All other systems reviewed and are negative.  Filed Vitals:   12/06/15 0951  BP: 118/69  Pulse: 76  Temp: 98.3 F (36.8 C)  TempSrc: Oral  Height: 5\' 3"  (1.6 m)  Weight: 193 lb (87.544 kg)  SpO2: 98%       Objective:   Physical Exam  Constitutional: She appears well-developed. No distress.  Cardiovascular: Normal rate, regular rhythm and normal heart sounds.   No murmur heard. Pulmonary/Chest: Effort normal and breath sounds normal. No respiratory distress. She has no wheezes. She has no rales. She exhibits no tenderness.  Abdominal: Soft. Bowel sounds are normal. She exhibits no distension and no mass. There is no tenderness.  Genitourinary: Cervix exhibits  discharge. Cervix exhibits no friability. Vaginal discharge found.    Musculoskeletal: Normal range of motion. She exhibits no edema.  Nursing note and vitals reviewed.      Assessment:     Amenorrhea Vaginitis/STD exposure Smoking     Plan:     1. Upreg positive again. Counseling done.     She will like to keep her pregnancy.     Schedule prenatal intake as soon as possible.     Start PNV as soon as possible.     All questions were answered.   2. Wet prep done. I will contact her with result.     HIV and RPR checked today due to unprotected sex with a new partner. Will not recheck lab with prenatal intake as discussed with Ree ShayJackie Warner.     I will call her with result.  3. Smoking cessation counseling done especially now that she is pregnant.     She is eager and interested in qutting.     I discussed CBT and counseling as the initial step in pregnancy.     I also referred her to Dr. Raymondo BandKoval for counseling and pharmacologic management in pregnancy.     She agreed to schedule appointment.

## 2015-12-06 NOTE — Patient Instructions (Addendum)
It was nice seeing you today. Your pregnancy test is positive. Please schedule prenatal intake as soon as possible with Ms Annice Pih. Start Prenatal vitamin as soon as possible. I will call you with other results. Follow up as needed. Now that you are pregnant, it is recommended that you stop smoking. The first initial treatment step is cognitive behavioral therapy and counseling which we did a bit. I will also recommend you schedule appointment with Dr. Raymondo Band who can help with smoking cessation. See your PCP soon   First Trimester of Pregnancy The first trimester of pregnancy is from week 1 until the end of week 12 (months 1 through 3). During this time, your baby will begin to develop inside you. At 6-8 weeks, the eyes and face are formed, and the heartbeat can be seen on ultrasound. At the end of 12 weeks, all the baby's organs are formed. Prenatal care is all the medical care you receive before the birth of your baby. Make sure you get good prenatal care and follow all of your doctor's instructions. HOME CARE  Medicines  Take medicine only as told by your doctor. Some medicines are safe and some are not during pregnancy.  Take your prenatal vitamins as told by your doctor.  Take medicine that helps you poop (stool softener) as needed if your doctor says it is okay. Diet  Eat regular, healthy meals.  Your doctor will tell you the amount of weight gain that is right for you.  Avoid raw meat and uncooked cheese.  If you feel sick to your stomach (nauseous) or throw up (vomit):  Eat 4 or 5 small meals a day instead of 3 large meals.  Try eating a few soda crackers.  Drink liquids between meals instead of during meals.  If you have a hard time pooping (constipation):  Eat high-fiber foods like fresh vegetables, fruit, and whole grains.  Drink enough fluids to keep your pee (urine) clear or pale yellow. Activity and Exercise  Exercise only as told by your doctor. Stop exercising if  you have cramps or pain in your lower belly (abdomen) or low back.  Try to avoid standing for long periods of time. Move your legs often if you must stand in one place for a long time.  Avoid heavy lifting.  Wear low-heeled shoes. Sit and stand up straight.  You can have sex unless your doctor tells you not to. Relief of Pain or Discomfort  Wear a good support bra if your breasts are sore.  Take warm water baths (sitz baths) to soothe pain or discomfort caused by hemorrhoids. Use hemorrhoid cream if your doctor says it is okay.  Rest with your legs raised if you have leg cramps or low back pain.  Wear support hose if you have puffy, bulging veins (varicose veins) in your legs. Raise (elevate) your feet for 15 minutes, 3-4 times a day. Limit salt in your diet. Prenatal Care  Schedule your prenatal visits by the twelfth week of pregnancy.  Write down your questions. Take them to your prenatal visits.  Keep all your prenatal visits as told by your doctor. Safety  Wear your seat belt at all times when driving.  Make a list of emergency phone numbers. The list should include numbers for family, friends, the hospital, and police and fire departments. General Tips  Ask your doctor for a referral to a local prenatal class. Begin classes no later than at the start of month 6 of your pregnancy.  Ask for help if you need counseling or help with nutrition. Your doctor can give you advice or tell you where to go for help.  Do not use hot tubs, steam rooms, or saunas.  Do not douche or use tampons or scented sanitary pads.  Do not cross your legs for long periods of time.  Avoid litter boxes and soil used by cats.  Avoid all smoking, herbs, and alcohol. Avoid drugs not approved by your doctor.  Do not use any tobacco products, including cigarettes, chewing tobacco, and electronic cigarettes. If you need help quitting, ask your doctor. You may get counseling or other support to help  you quit.  Visit your dentist. At home, brush your teeth with a soft toothbrush. Be gentle when you floss. GET HELP IF:  You are dizzy.  You have mild cramps or pressure in your lower belly.  You have a nagging pain in your belly area.  You continue to feel sick to your stomach, throw up, or have watery poop (diarrhea).  You have a bad smelling fluid coming from your vagina.  You have pain with peeing (urination).  You have increased puffiness (swelling) in your face, hands, legs, or ankles. GET HELP RIGHT AWAY IF:   You have a fever.  You are leaking fluid from your vagina.  You have spotting or bleeding from your vagina.  You have very bad belly cramping or pain.  You gain or lose weight rapidly.  You throw up blood. It may look like coffee grounds.  You are around people who have [place] measles, fifth disease, or chickenpox.  You have a very bad headache.  You have shortness of breath.  You have any kind of trauma, such as from a fall or a car accident.   This information is not intended to replace advice given to you by your health care provider. Make sure you discuss any questions you have with your health care provider.   Document Released: 12/12/2007 Document Revised: 07/16/2014 Document Reviewed: 05/05/2013 Elsevier Interactive Patient Education [employer]2016 [employer] Inc[employer]

## 2015-12-06 NOTE — Telephone Encounter (Signed)
Message left to call back.   Note when she calls inform her of positive bacterial vaginosis and yeast. Medication has been sent to pharmacy for pick.  Note oral metronidazole canceled. I spoke with pharmacy to cancel it ( Name [facility])

## 2015-12-07 ENCOUNTER — Telehealth: Payer: Self-pay

## 2015-12-07 LAB — RPR

## 2015-12-07 LAB — CERVICOVAGINAL ANCILLARY ONLY
Chlamydia: NEGATIVE
NEISSERIA GONORRHEA: NEGATIVE

## 2015-12-07 NOTE — Telephone Encounter (Signed)
-----   Message from Doreene ElandKehinde T Eniola, MD sent at 12/07/2015  9:09 AM EDT ----- Please call patient to inform her that her HIV and RPR test results are negative. I will contact her as soon as I get the other test results.

## 2015-12-07 NOTE — Telephone Encounter (Signed)
Informed pt of negative test results, HIV, RPR. Page, cma.

## 2015-12-07 NOTE — Telephone Encounter (Signed)
Left message for pt to return my call for test results. Page, cma.

## 2015-12-07 NOTE — Telephone Encounter (Signed)
Neg GC/Chlamydia discussed with patient.  LMP: Visit#: [id number].[id number] CC: ANCILLARY TESTING REPORT Molecular Results Test Result Chlamydia T. Negative Neisseria G. Negative Source CervicoVaginal Ancillary Only (Swab)

## 2015-12-07 NOTE — Telephone Encounter (Signed)
-----   Message from Kehinde T Eniola, MD sent at 12/07/2015  9:09 AM EDT ----- Please call patient to inform her that her HIV and RPR test results are negative. I will contact her as soon as I get the other test results. 

## 2015-12-13 ENCOUNTER — Other Ambulatory Visit: Payer: Self-pay | Admitting: Family Medicine

## 2015-12-13 NOTE — Telephone Encounter (Signed)
Needs refill on diflucan ( yeast infection). It has not cleared up.   [facility] [address]

## 2015-12-14 MED ORDER — FLUCONAZOLE 150 MG PO TABS: 150.0000 mg | ORAL_TABLET | Freq: Once | ORAL | Status: DC

## 2015-12-14 NOTE — Telephone Encounter (Signed)
Patient is aware of this. Sabrina Buckley,CMA  

## 2015-12-14 NOTE — Telephone Encounter (Signed)
Lm for pt- Inform if she calls back another rx has been sent in for her. Page, cma.

## 2015-12-14 NOTE — Telephone Encounter (Signed)
1 more dose of fluconazole sent in. If it does not resolve with this, the pt will need to come in.  Thanks, Joanna Puffrystal S. Dorsey, MD [facility] Resident  12/14/2015, 1:48 PM

## 2015-12-20 ENCOUNTER — Encounter: Payer: Self-pay | Admitting: Internal Medicine

## 2015-12-20 ENCOUNTER — Ambulatory Visit (INDEPENDENT_AMBULATORY_CARE_PROVIDER_SITE_OTHER): Payer: Medicaid Other | Admitting: Internal Medicine

## 2015-12-20 VITALS — BP 112/63 | HR 75 | Temp 98.4°F | Ht 63.0 in | Wt 191.6 lb

## 2015-12-20 DIAGNOSIS — O0001 Abdominal pregnancy with intrauterine pregnancy: Secondary | ICD-10-CM | POA: Diagnosis not present

## 2015-12-20 DIAGNOSIS — E669 Obesity, unspecified: Secondary | ICD-10-CM | POA: Diagnosis not present

## 2015-12-20 DIAGNOSIS — R112 Nausea with vomiting, unspecified: Secondary | ICD-10-CM

## 2015-12-20 MED ORDER — VITAMIN B-6 25 MG PO TABS: 25.0000 mg | ORAL_TABLET | Freq: Three times a day (TID) | ORAL | Status: DC | PRN

## 2015-12-20 MED ORDER — PRENATAL 27-0.8 MG PO TABS: 1.0000 | ORAL_TABLET | Freq: Every day | ORAL | Status: DC

## 2015-12-20 NOTE — Progress Notes (Signed)
Sabrina Buckley is a 30 y.o. [id number] at 7225w0d via LMP who presents for her initial prenatal visit. Pregnancy is not planned.However patient is happy about it.  She reports nausea. Vomited x 4 times last weeks. Patient would like to something to help with this.  She  is not taking PNV. - Does not like the prenatal vitamins. See flow sheet for details.  PMH, POBH, FH, meds, allergies and Social Hx reviewed.  Prenatal Exam: Gen: Well nourished, well developed.  No distress.  Vitals noted. HEENT: Normocephalic, atraumatic.  Neck supple without cervical lymphadenopathy, thyromegaly or thyroid nodules.  Fair dentition. CV: RRR no murmur, gallops or rubs Lungs: CTAB.  Normal respiratory effort without wheezes or rales. Abd: soft, NTND. +BS.  Uterus not appreciated above pelvis. GU: Normal external female genitalia without lesions.  Normal vaginal, well rugated without lesions. No vaginal discharge.  Bimanual exam: No adnexal mass or TTP. No CMT.  Uterus size not determined  Ext: No clubbing, cyanosis or edema. Psych: Normal grooming and dress.  Not depressed or anxious appearing.  Normal thought content and process without flight of ideas or looseness of associations.  Assessment & Plan: 1) 30 y.o. [id number] at 8525w0d via LMP doing well.  Current pregnancy issues include nausea and vomiting, prescribed pyridoxine 25 mg q8hrs for nausea  Dating is reliable. Prenatal labs not yet completed, will need to be reviewed once they return.  Genetic screening offered: Agreeable to genetic screening.  Early glucola is not indicated as patient with two previous pregnancies and no hx of diabetes during pregnancy. However BMI of 34, therefore referral to nutrition for counseling during pregnancy  PHQ-9 score of 10, with some difficult- would like to talk to behavioral medicine at some point, however not today.  Pregnancy Medical Home forms completed and reviewed.  Smoker - patient to follow up with Dr. Raymondo BandKoval  next week for smoking cessation  Bleeding and pain precautions reviewed. Importance of prenatal vitamins reviewed. Prescribed prenatal vitamins  Follow up in 4 weeks.

## 2015-12-20 NOTE — Patient Instructions (Signed)
Prenatal Care °WHAT IS PRENATAL CARE?  °Prenatal care is the process of caring for a pregnant woman before she gives birth. Prenatal care makes sure that she and her baby remain as healthy as possible throughout pregnancy. Prenatal care may be provided by a midwife, family practice health care provider, or a childbirth and pregnancy specialist (obstetrician). Prenatal care may include physical examinations, testing, treatments, and education on nutrition, lifestyle, and social support services. °WHY IS PRENATAL CARE SO IMPORTANT?  °Early and consistent prenatal care increases the chance that you and your baby will remain healthy throughout your pregnancy. This type of care also decreases a baby's risk of being born too early (prematurely), or being born smaller than expected (small for gestational age). Any underlying medical conditions you may have that could pose a risk during your pregnancy are discussed during prenatal care visits. You will also be monitored regularly for any new conditions that may arise during your pregnancy so they can be treated quickly and effectively. °WHAT HAPPENS DURING PRENATAL CARE VISITS? °Prenatal care visits may include the following: °Discussion °Tell your health care provider about any new signs or symptoms you have experienced since your last visit. These might include: °· Nausea or vomiting. °· Increased or decreased level of energy. °· Difficulty sleeping. °· Back or leg pain. °· Weight changes. °· Frequent urination. °· Shortness of breath with physical activity. °· Changes in your skin, such as the development of a rash or itchiness. °· Vaginal discharge or bleeding. °· Feelings of excitement or nervousness. °· Changes in your baby's movements. °You may want to write down any questions or topics you want to discuss with your health care provider and bring them with you to your appointment. °Examination °During your first prenatal care visit, you will likely have a complete  physical exam. Your health care provider will often examine your vagina, cervix, and the position of your uterus, as well as check your heart, lungs, and other body systems. As your pregnancy progresses, your health care provider will measure the size of your uterus and your baby's position inside your uterus. He or she may also examine you for early signs of labor. Your prenatal visits may also include checking your blood pressure and, after about 10-12 weeks of pregnancy, listening to your baby's heartbeat. °Testing °Regular testing often includes: °· Urinalysis. This checks your urine for glucose, protein, or signs of infection. °· Blood count. This checks the levels of white and red blood cells in your body. °· Tests for sexually transmitted infections (STIs). Testing for STIs at the beginning of pregnancy is routinely done and is required in many states. °· Antibody testing. You will be checked to see if you are immune to certain illnesses, such as rubella, that can affect a developing fetus. °· Glucose screen. Around 24-28 weeks of pregnancy, your blood glucose level will be checked for signs of gestational diabetes. Follow-up tests may be recommended. °· Group B strep. This is a bacteria that is commonly found inside a woman's vagina. This test will inform your health care provider if you need an antibiotic to reduce the amount of this bacteria in your body prior to labor and childbirth. °· Ultrasound. Many pregnant women undergo an ultrasound screening around 18-20 weeks of pregnancy to evaluate the health of the fetus and check for any developmental abnormalities. °· HIV (human immunodeficiency virus) testing. Early in your pregnancy, you will be screened for HIV. If you are at high risk for HIV, this test   may be repeated during your third trimester of pregnancy. °You may be offered other testing based on your age, personal or family medical history, or other factors.  °HOW OFTEN SHOULD I PLAN TO SEE MY  HEALTH CARE PROVIDER FOR PRENATAL CARE? °Your prenatal care check-up schedule depends on any medical conditions you have before, or develop during, your pregnancy. If you do not have any underlying medical conditions, you will likely be seen for checkups: °· Monthly, during the first 6 months of pregnancy. °· Twice a month during months 7 and 8 of pregnancy. °· Weekly starting in the 9th month of pregnancy and until delivery. °If you develop signs of early labor or other concerning signs or symptoms, you may need to see your health care provider more often. Ask your health care provider what prenatal care schedule is best for you. °WHAT CAN I DO TO KEEP MYSELF AND MY BABY AS HEALTHY AS POSSIBLE DURING MY PREGNANCY? °· Take a prenatal vitamin containing 400 micrograms (0.4 mg) of folic acid every day. Your health care provider may also ask you to take additional vitamins such as iodine, vitamin D, iron, copper, and zinc. °· Take 1500-2000 mg of calcium daily starting at your 20th week of pregnancy until you deliver your baby. °· Make sure you are up to date on your vaccinations. Unless directed otherwise by your health care provider: °¨ You should receive a tetanus, diphtheria, and pertussis (Tdap) vaccination between the 27th and 36th week of your pregnancy, regardless of when your last Tdap immunization occurred. This helps protect your baby from whooping cough (pertussis) after he or she is born. °¨ You should receive an annual inactivated influenza vaccine (IIV) to help protect you and your baby from influenza. This can be done at any point during your pregnancy. °· Eat a well-rounded diet that includes: °¨ Fresh fruits and vegetables. °¨ Lean proteins. °¨ Calcium-rich foods such as milk, yogurt, hard cheeses, and dark, leafy greens. °¨ Whole grain breads. °· Do not eat seafood high in mercury, including: °¨ Swordfish. °¨ Tilefish. °¨ Shark. °¨ King mackerel. °¨ More than 6 oz tuna per week. °· Do not eat: °¨ Raw  or undercooked meats or eggs. °¨ Unpasteurized foods, such as soft cheeses (brie, blue, or feta), juices, and milks. °¨ Lunch meats. °¨ Hot dogs that have not been heated until they are steaming. °· Drink enough water to keep your urine clear or pale yellow. For many women, this may be 10 or more 8 oz glasses of water each day. Keeping yourself hydrated helps deliver nutrients to your baby and may prevent the start of pre-term uterine contractions. °· Do not use any tobacco products including cigarettes, chewing tobacco, or electronic cigarettes. If you need help quitting, ask your health care provider. °· Do not drink beverages containing alcohol. No safe level of alcohol consumption during pregnancy has been determined. °· Do not use any illegal drugs. These can harm your developing baby or cause a miscarriage. °· Ask your health care provider or pharmacist before taking any prescription or over-the-counter medicines, herbs, or supplements. °· Limit your caffeine intake to no more than 200 mg per day. °· Exercise. Unless told otherwise by your health care provider, try to get 30 minutes of moderate exercise most days of the week. Do not  do high-impact activities, contact sports, or activities with a high risk of falling, such as horseback riding or downhill skiing. °· Get plenty of rest. °· Avoid anything that raises your   body temperature, such as hot tubs and saunas. °· If you own a cat, do not empty its litter box. Bacteria contained in cat feces can cause an infection called toxoplasmosis. This can result in serious harm to the fetus. °· Stay away from chemicals such as insecticides, lead, mercury, and cleaning or paint products that contain solvents. °· Do not have any X-rays taken unless medically necessary. °· Take a childbirth and breastfeeding preparation class. Ask your health care provider if you need a referral or recommendation. °  °This information is not intended to replace advice given to you by  your health care provider. Make sure you discuss any questions you have with your health care provider. °  °Document Released: 06/28/2003 Document Revised: 07/16/2014 Document Reviewed: 09/09/2013 °Elsevier Interactive Patient Education ©2016 [employer] ° °

## 2015-12-21 LAB — OBSTETRIC PANEL
Antibody Screen: NEGATIVE
BASOS ABS: 0 {cells}/uL (ref 0–200)
Basophils Relative: 0 %
EOS ABS: 170 {cells}/uL (ref 15–500)
EOS PCT: 2 %
HCT: 38.3 % (ref 35.0–45.0)
HEMOGLOBIN: 12.6 g/dL (ref 11.7–15.5)
Hepatitis B Surface Ag: NEGATIVE
LYMPHS PCT: 27 %
Lymphs Abs: 2295 cells/uL (ref 850–3900)
MCH: 29.4 pg (ref 27.0–33.0)
MCHC: 32.9 g/dL (ref 32.0–36.0)
MCV: 89.5 fL (ref 80.0–100.0)
MPV: 9.1 fL (ref 7.5–12.5)
Monocytes Absolute: 765 cells/uL (ref 200–950)
Monocytes Relative: 9 %
NEUTROS ABS: 5270 {cells}/uL (ref 1500–7800)
NEUTROS PCT: 62 %
Platelets: 305 10*3/uL (ref 140–400)
RBC: 4.28 MIL/uL (ref 3.80–5.10)
RDW: 13.3 % (ref 11.0–15.0)
RH TYPE: POSITIVE
RUBELLA: 8.37 {index} — AB (ref <0.90)
WBC: 8.5 10*3/uL (ref 3.8–10.8)

## 2015-12-21 LAB — HIV ANTIBODY (ROUTINE TESTING W REFLEX): HIV: NONREACTIVE

## 2015-12-21 LAB — SICKLE CELL SCREEN: SICKLE CELL SCREEN: NEGATIVE

## 2015-12-21 LAB — CULTURE, OB URINE
Colony Count: NO GROWTH
ORGANISM ID, BACTERIA: NO GROWTH

## 2015-12-23 ENCOUNTER — Encounter: Payer: Self-pay | Admitting: Internal Medicine

## 2015-12-29 ENCOUNTER — Ambulatory Visit: Payer: Self-pay | Admitting: Pharmacist

## 2016-01-05 ENCOUNTER — Ambulatory Visit: Payer: Self-pay | Admitting: Pharmacist

## 2016-01-17 ENCOUNTER — Inpatient Hospital Stay (HOSPITAL_COMMUNITY): Payer: Medicaid Other

## 2016-01-17 ENCOUNTER — Ambulatory Visit (INDEPENDENT_AMBULATORY_CARE_PROVIDER_SITE_OTHER): Payer: Medicaid Other | Admitting: Internal Medicine

## 2016-01-17 ENCOUNTER — Inpatient Hospital Stay (HOSPITAL_COMMUNITY)
Admission: AD | Admit: 2016-01-17 | Discharge: 2016-01-17 | Disposition: A | Discharge status: Home / Self Care | Payer: Medicaid Other | Source: Ambulatory Visit | Attending: Obstetrics & Gynecology | Admitting: Obstetrics & Gynecology

## 2016-01-17 VITALS — BP 132/80 | HR 78 | Temp 98.4°F | Wt 192.0 lb

## 2016-01-17 DIAGNOSIS — R109 Unspecified abdominal pain: Secondary | ICD-10-CM | POA: Insufficient documentation

## 2016-01-17 DIAGNOSIS — Z3A12 12 weeks gestation of pregnancy: Secondary | ICD-10-CM | POA: Diagnosis not present

## 2016-01-17 DIAGNOSIS — O4691 Antepartum hemorrhage, unspecified, first trimester: Secondary | ICD-10-CM | POA: Diagnosis not present

## 2016-01-17 DIAGNOSIS — O99332 Smoking (tobacco) complicating pregnancy, second trimester: Secondary | ICD-10-CM | POA: Insufficient documentation

## 2016-01-17 DIAGNOSIS — Z3480 Encounter for supervision of other normal pregnancy, unspecified trimester: Secondary | ICD-10-CM | POA: Insufficient documentation

## 2016-01-17 DIAGNOSIS — O209 Hemorrhage in early pregnancy, unspecified: Secondary | ICD-10-CM

## 2016-01-17 DIAGNOSIS — Z8619 Personal history of other infectious and parasitic diseases: Secondary | ICD-10-CM | POA: Insufficient documentation

## 2016-01-17 DIAGNOSIS — Z3481 Encounter for supervision of other normal pregnancy, first trimester: Secondary | ICD-10-CM

## 2016-01-17 LAB — URINE MICROSCOPIC-ADD ON

## 2016-01-17 LAB — URINALYSIS, ROUTINE W REFLEX MICROSCOPIC
BILIRUBIN URINE: NEGATIVE
GLUCOSE, UA: NEGATIVE mg/dL
KETONES UR: NEGATIVE mg/dL
Leukocytes, UA: NEGATIVE
NITRITE: NEGATIVE
PH: 6 (ref 5.0–8.0)
Protein, ur: NEGATIVE mg/dL
Specific Gravity, Urine: 1.02 (ref 1.005–1.030)

## 2016-01-17 NOTE — MAU Provider Note (Signed)
History     CSN: [id number]  Arrival date and time: 01/17/16 1803   First Provider Initiated Contact with Patient 01/17/16 2130      Chief Complaint  Patient presents with  . Vaginal Bleeding   HPI [name] is a 30 y.o. [phone] at [redacted]w[redacted]d who presents to MAU today with complaint of vaginal bleeding. The patient states that she had an appointment at [facility] for routine prenatal care today. She was not bleeding at that time, but light bleeding after leaving there this afternoon. She states only a small amount noted on pad, this has continued since arrival in MAU. She states mild associated cramping earlier, but denies pain now. She has not taken anything for pain. She denies any change in discharge, fever, UTI symptoms. She did not have a vaginal exam today, but did have FHTs with doppler in the office.   OB History    Gravida Para Term Preterm AB TAB SAB Ectopic Multiple Living   Past Medical History  Diagnosis Date  . Chlamydia     Past Surgical History  Procedure Laterality Date  . Hernia repair    . Umbilical hernia repair N/A 2013    No family history on file.  Social History  Substance Use Topics  . Smoking status: Current Every Day Smoker -- 0.25 packs/day    Types: Cigarettes  . Smokeless tobacco: Not on file  . Alcohol Use: No    Allergies: No Known Allergies  Prescriptions prior to admission  Medication Sig Dispense Refill Last Dose  . fluconazole (DIFLUCAN) 150 MG tablet Take 1 tablet (150 mg total) by mouth once. 1 tablet 0   . Prenatal Vit-Fe Fumarate-FA (MULTIVITAMIN-PRENATAL) 27-0.8 MG TABS tablet Take 1 tablet by mouth daily at 12 noon. 30 each 0   . vitamin B-6 (PYRIDOXINE) 25 MG tablet Take 1 tablet (25 mg total) by mouth every 8 (eight) hours as needed (nausea/vomiting). 30 tablet 0     Review of Systems  Constitutional: Negative for fever and malaise/fatigue.  Gastrointestinal: Positive for abdominal pain.  Negative for nausea, vomiting, diarrhea and constipation.  Genitourinary: Negative for dysuria, urgency and frequency.       + vaginal bleeding   Physical Exam   Blood pressure 131/83, pulse 75, temperature 98.1 F (36.7 C), temperature source Oral, resp. rate 18, last menstrual period 10/25/2015.  Physical Exam  Nursing note and vitals reviewed. Constitutional: She is oriented to person, place, and time. She appears well-developed and well-nourished. No distress.  HENT:  Head: Normocephalic and atraumatic.  Cardiovascular: Normal rate.   Respiratory: Effort normal.  GI: Soft. She exhibits no distension and no mass. There is no tenderness. There is no rebound and no guarding.  Neurological: She is alert and oriented to person, place, and time.  Skin: Skin is warm and dry. No erythema.  Psychiatric: She has a normal mood and affect.    Results for orders placed or performed during the hospital encounter of 01/17/16 (from the past 24 hour(s))  Urinalysis, Routine w reflex microscopic (not at [facility])     Status: Abnormal   Collection Time: 01/17/16  6:55 PM  Result Value Ref Range   Color, Urine YELLOW YELLOW   APPearance CLEAR CLEAR   Specific Gravity, Urine 1.020 1.005 - 1.030   pH 6.0 5.0 - 8.0   Glucose, UA NEGATIVE NEGATIVE mg/dL   Hgb urine dipstick  SMALL (A) NEGATIVE   Bilirubin Urine NEGATIVE NEGATIVE   Ketones, ur NEGATIVE NEGATIVE mg/dL   Protein, ur NEGATIVE NEGATIVE mg/dL   Nitrite NEGATIVE NEGATIVE   Leukocytes, UA NEGATIVE NEGATIVE  Urine microscopic-add on     Status: Abnormal   Collection Time: 01/17/16  6:55 PM  Result Value Ref Range   Squamous Epithelial / LPF 0-5 (A) NONE SEEN   WBC, UA 0-5 0 - 5 WBC/hpf   RBC / HPF 0-5 0 - 5 RBC/hpf   Bacteria, UA FEW (A) NONE SEEN    [place] Ob Comp Less 14 Wks  01/17/2016  CLINICAL DATA:  30 year old pregnant female with bleeding and spotting. EXAM: OBSTETRIC <14 WK ULTRASOUND TECHNIQUE: Transabdominal ultrasound was  performed for evaluation of the gestation as well as the maternal uterus and adnexal regions. COMPARISON:  None for this pregnancy FINDINGS: Intrauterine gestational sac: Single Yolk sac:  None visualized Embryo:  Present Cardiac Activity: Detected Heart Rate: 157 bpm CRL:   60  mm   12 w 4 d                  US EDC: 07/27/2016 Subchorionic hemorrhage:  None visualized. Maternal uterus/adnexae: The maternal ovaries appear unremarkable. There is a 1.6 x 1.5 x 1.3 cm corpus luteum in the left ovary. There is a 1.8 x 1.5 x 1.5 cm fibroid in the left uterine body. No free fluid within the pelvis. IMPRESSION: Single live intrauterine pregnancy with an estimated gestational age of [redacted] weeks, 4 days. Electronically Signed   By: Elgie CollardArash  Radparvar M.D.   On: 01/17/2016 21:26    MAU Course  Procedures None  MDM UA today US to assess for viability confirms FHTs 157 bpm, no SCH  Assessment and Plan  A: SIUP at 5139w0d Vaginal bleeding in first trimester  P: Discharge home Bleeding precautions and pelvic rest discussed Patient advised to follow-up with [facility] as scheduled for routine prenatal care or sooner PRN Patient may return to MAU as needed or if her condition were to change or worsen   Marny LowensteinJulie N Yancy Knoble, PA-C  01/17/2016, 9:30 PM

## 2016-01-17 NOTE — Progress Notes (Signed)
Sabrina CorningDominique N Buckley is a 30 y.o. [id number] at 6822w0d for routine follow up.  She reports no complaints. No contractions, bleeding, loss of fluid Denies having any nausea or vomiting.  See flow sheet for details.  Uterine Size: 8 cm  FHR 160 bpm   A/P: Pregnancy at 1722w0d.  Doing well.   No pregnancy issues  Genetic screening at next appointment  Anatomy ultrasound to be scheduled at next  Will get an early Glucola test due to BMI, African American status. During initial visit, discussed not doing 1 hour Glucola, however as patient's previous pregnancy were several years ago and patient now with increased BMI would like to obtain. Patient to return for a lab visit.  Smoker - 4 cigarettes a day. Missed smoking cessation appintment with Dr. Raymondo BandKoval, will try to reschedule  Has not scheduled an appointment with nutritionist, will provide number to the patient, patient agrees to try to schedule  Bleeding and pain precautions reviewed. Follow up 4 weeks.  Addendum:   At the end visit, patient noted small amount of bleeding on panty-liner, vaginal spotting. No abdominal pain complaints during visit nor before visit. Discussed with Dr. Lum BabeEniola whom [facility]

## 2016-01-17 NOTE — MAU Note (Addendum)
Went to dr's appt  A little while ago.  Heard the baby's heart beat  Had some discomfort when pressing on lower abd.   Afterwards, noted some bleeding. So dr told her to come over here.

## 2016-01-17 NOTE — Patient Instructions (Addendum)
Cigarette Session - Reschedule with Dr. Raymondo Band  Schedule an appointment with nutritionist  Lab appointment for Glucola testing  Follow up in 4 weeks. Please go immediately to [facility] hospital for evaluation of vaginal bleeding.   First Trimester of Pregnancy The first trimester of pregnancy is from week 1 until the end of week 12 (months 1 through 3). A week after a sperm fertilizes an egg, the egg will implant on the wall of the uterus. This embryo will begin to develop into a baby. Genes from you and your partner are forming the baby. The female genes determine whether the baby is a boy or a girl. At 6-8 weeks, the eyes and face are formed, and the heartbeat can be seen on ultrasound. At the end of 12 weeks, all the baby's organs are formed.  Now that you are pregnant, you will want to do everything you can to have a healthy baby. Two of the most important things are to get good prenatal care and to follow your health care provider's instructions. Prenatal care is all the medical care you receive before the baby's birth. This care will help prevent, find, and treat any problems during the pregnancy and childbirth. BODY CHANGES Your body goes through many changes during pregnancy. The changes vary from woman to woman.   You may gain or lose a couple of pounds at first.  You may feel sick to your stomach (nauseous) and throw up (vomit). If the vomiting is uncontrollable, call your health care provider.  You may tire easily.  You may develop headaches that can be relieved by medicines approved by your health care provider.  You may urinate more often. Painful urination may mean you have a bladder infection.  You may develop heartburn as a result of your pregnancy.  You may develop constipation because certain hormones are causing the muscles that push waste through your intestines to slow down.  You may develop hemorrhoids or swollen, bulging veins (varicose veins).  Your breasts may begin to  grow larger and become tender. Your nipples may stick out more, and the tissue that surrounds them (areola) may become darker.  Your gums may bleed and may be sensitive to brushing and flossing.  Dark spots or blotches (chloasma, mask of pregnancy) may develop on your face. This will likely fade after the baby is born.  Your menstrual periods will stop.  You may have a loss of appetite.  You may develop cravings for certain kinds of food.  You may have changes in your emotions from day to day, such as being excited to be pregnant or being concerned that something may go wrong with the pregnancy and baby.  You may have more vivid and strange dreams.  You may have changes in your hair. These can include thickening of your hair, rapid growth, and changes in texture. Some women also have hair loss during or after pregnancy, or hair that feels dry or thin. Your hair will most likely return to normal after your baby is born. WHAT TO EXPECT AT YOUR PRENATAL VISITS During a routine prenatal visit:  You will be weighed to make sure you and the baby are growing normally.  Your blood pressure will be taken.  Your abdomen will be measured to track your baby's growth.  The fetal heartbeat will be listened to starting around week 10 or 12 of your pregnancy.  Test results from any previous visits will be discussed. Your health care provider may ask you:  How you are feeling.  If you are feeling the baby move.  If you have had any abnormal symptoms, such as leaking fluid, bleeding, severe headaches, or abdominal cramping.  If you are using any tobacco products, including cigarettes, chewing tobacco, and electronic cigarettes.  If you have any questions. Other tests that may be performed during your first trimester include:  Blood tests to find your blood type and to check for the presence of any previous infections. They will also be used to check for low iron levels (anemia) and Rh  antibodies. Later in the pregnancy, blood tests for diabetes will be done along with other tests if problems develop.  Urine tests to check for infections, diabetes, or protein in the urine.  An ultrasound to confirm the proper growth and development of the baby.  An amniocentesis to check for possible genetic problems.  Fetal screens for spina bifida and Down syndrome.  You may need other tests to make sure you and the baby are doing well.  HIV (human immunodeficiency virus) testing. Routine prenatal testing includes screening for HIV, unless you choose not to have this test. HOME CARE INSTRUCTIONS  Medicines  Follow your health care provider's instructions regarding medicine use. Specific medicines may be either safe or unsafe to take during pregnancy.  Take your prenatal vitamins as directed.  If you develop constipation, try taking a stool softener if your health care provider approves. Diet  Eat regular, well-balanced meals. Choose a variety of foods, such as meat or vegetable-based protein, fish, milk and low-fat dairy products, vegetables, fruits, and whole grain breads and cereals. Your health care provider will help you determine the amount of weight gain that is right for you.  Avoid raw meat and uncooked cheese. These carry germs that can cause birth defects in the baby.  Eating four or five small meals rather than three large meals a day may help relieve nausea and vomiting. If you start to feel nauseous, eating a few soda crackers can be helpful. Drinking liquids between meals instead of during meals also seems to help nausea and vomiting.  If you develop constipation, eat more high-fiber foods, such as fresh vegetables or fruit and whole grains. Drink enough fluids to keep your urine clear or pale yellow. Activity and Exercise  Exercise only as directed by your health care provider. Exercising will help you:  Control your weight.  Stay in shape.  Be prepared for  labor and delivery.  Experiencing pain or cramping in the lower abdomen or low back is a good sign that you should stop exercising. Check with your health care provider before continuing normal exercises.  Try to avoid standing for long periods of time. Move your legs often if you must stand in one place for a long time.  Avoid heavy lifting.  Wear low-heeled shoes, and practice good posture.  You may continue to have sex unless your health care provider directs you otherwise. Relief of Pain or Discomfort  Wear a good support bra for breast tenderness.   Take warm sitz baths to soothe any pain or discomfort caused by hemorrhoids. Use hemorrhoid cream if your health care provider approves.   Rest with your legs elevated if you have leg cramps or low back pain.  If you develop varicose veins in your legs, wear support hose. Elevate your feet for 15 minutes, 3-4 times a day. Limit salt in your diet. Prenatal Care  Schedule your prenatal visits by the twelfth week of pregnancy.  They are usually scheduled monthly at first, then more often in the last 2 months before delivery.  Write down your questions. Take them to your prenatal visits.  Keep all your prenatal visits as directed by your health care provider. Safety  Wear your seat belt at all times when driving.  Make a list of emergency phone numbers, including numbers for family, friends, the hospital, and police and fire departments. General Tips  Ask your health care provider for a referral to a local prenatal education class. Begin classes no later than at the beginning of month 6 of your pregnancy.  Ask for help if you have counseling or nutritional needs during pregnancy. Your health care provider can offer advice or refer you to specialists for help with various needs.  Do not use hot tubs, steam rooms, or saunas.  Do not douche or use tampons or scented sanitary pads.  Do not cross your legs for long periods of  time.  Avoid cat litter boxes and soil used by cats. These carry germs that can cause birth defects in the baby and possibly loss of the fetus by miscarriage or stillbirth.  Avoid all smoking, herbs, alcohol, and medicines not prescribed by your health care provider. Chemicals in these affect the formation and growth of the baby.  Do not use any tobacco products, including cigarettes, chewing tobacco, and electronic cigarettes. If you need help quitting, ask your health care provider. You may receive counseling support and other resources to help you quit.  Schedule a dentist appointment. At home, brush your teeth with a soft toothbrush and be gentle when you floss. SEEK MEDICAL CARE IF:   You have dizziness.  You have mild pelvic cramps, pelvic pressure, or nagging pain in the abdominal area.  You have persistent nausea, vomiting, or diarrhea.  You have a bad smelling vaginal discharge.  You have pain with urination.  You notice increased swelling in your face, hands, legs, or ankles. SEEK IMMEDIATE MEDICAL CARE IF:   You have a fever.  You are leaking fluid from your vagina.  You have spotting or bleeding from your vagina.  You have severe abdominal cramping or pain.  You have rapid weight gain or loss.  You vomit blood or material that looks like coffee grounds.  You are exposed to [place] measles and have never had them.  You are exposed to fifth disease or chickenpox.  You develop a severe headache.  You have shortness of breath.  You have any kind of trauma, such as from a fall or a car accident.   This information is not intended to replace advice given to you by your health care provider. Make sure you discuss any questions you have with your health care provider.   Document Released: 06/19/2001 Document Revised: 07/16/2014 Document Reviewed: 05/05/2013 Elsevier Interactive Patient Education [employer]2016 Elsevier Inc.

## 2016-01-17 NOTE — Discharge Instructions (Signed)
Pelvic Rest °Pelvic rest is sometimes recommended for women when:  °· The placenta is partially or completely covering the opening of the cervix (placenta previa). °· There is bleeding between the uterine wall and the amniotic sac in the first trimester (subchorionic hemorrhage). °· The cervix begins to open without labor starting (incompetent cervix, cervical insufficiency). °· The labor is too early (preterm labor). °HOME CARE INSTRUCTIONS °· Do not have sexual intercourse, stimulation, or an orgasm. °· Do not use tampons, douche, or put anything in the vagina. °· Do not lift anything over 10 pounds (4.5 kg). °· Avoid strenuous activity or straining your pelvic muscles. °SEEK MEDICAL CARE IF:  °· You have any vaginal bleeding during pregnancy. Treat this as a potential emergency. °· You have cramping pain felt low in the stomach (stronger than menstrual cramps). °· You notice vaginal discharge (watery, mucus, or bloody). °· You have a low, dull backache. °· There are regular contractions or uterine tightening. °SEEK IMMEDIATE MEDICAL CARE IF: °You have vaginal bleeding and have placenta previa.  °  °This information is not intended to replace advice given to you by your health care provider. Make sure you discuss any questions you have with your health care provider. °  °Document Released: 10/20/2010 Document Revised: 09/17/2011 Document Reviewed: 12/27/2014 °Elsevier Interactive Patient Education ©2016 Elsevier Inc. ° °Vaginal Bleeding During Pregnancy, First Trimester °A small amount of bleeding (spotting) from the vagina is common in early pregnancy. Sometimes the bleeding is normal and is not a problem, and sometimes it is a sign of something serious. Be sure to tell your doctor about any bleeding from your vagina right away. °HOME CARE °· Watch your condition for any changes. °· Follow your doctor's instructions about how active you can be. °· If you are on bed rest: °¨ You may need to stay in bed and only  get up to use the bathroom. °¨ You may be allowed to do some activities. °¨ If you need help, make plans for someone to help you. °· Write down: °¨ The number of pads you use each day. °¨ How often you change pads. °¨ How soaked (saturated) your pads are. °· Do not use tampons. °· Do not douche. °· Do not have sex or orgasms until your doctor says it is okay. °· If you pass any tissue from your vagina, save the tissue so you can show it to your doctor. °· Only take medicines as told by your doctor. °· Do not take aspirin because it can make you bleed. °· Keep all follow-up visits as told by your doctor. °GET HELP IF:  °· You bleed from your vagina. °· You have cramps. °· You have labor pains. °· You have a fever that does not go away after you take medicine. °GET HELP RIGHT AWAY IF:  °· You have very bad cramps in your back or belly (abdomen). °· You pass large clots or tissue from your vagina. °· You bleed more. °· You feel light-headed or weak. °· You pass out (faint). °· You have chills. °· You are leaking fluid or have a gush of fluid from your vagina. °· You pass out while pooping (having a bowel movement). °MAKE SURE YOU: °· Understand these instructions. °· Will watch your condition. °· Will get help right away if you are not doing well or get worse. °  °This information is not intended to replace advice given to you by your health care provider. Make sure you discuss any questions   you have with your health care provider. °  °Document Released: 11/09/2013 Document Reviewed: 11/09/2013 °Elsevier Interactive Patient Education ©2016 Elsevier Inc. ° °

## 2016-02-08 ENCOUNTER — Ambulatory Visit (INDEPENDENT_AMBULATORY_CARE_PROVIDER_SITE_OTHER): Payer: Medicaid Other | Admitting: Family Medicine

## 2016-02-08 VITALS — BP 132/81 | HR 90 | Temp 98.1°F | Wt 193.0 lb

## 2016-02-08 DIAGNOSIS — Z3492 Encounter for supervision of normal pregnancy, unspecified, second trimester: Secondary | ICD-10-CM | POA: Diagnosis not present

## 2016-02-08 DIAGNOSIS — N898 Other specified noninflammatory disorders of vagina: Secondary | ICD-10-CM | POA: Diagnosis not present

## 2016-02-08 DIAGNOSIS — O26892 Other specified pregnancy related conditions, second trimester: Secondary | ICD-10-CM

## 2016-02-08 LAB — GLUCOSE, CAPILLARY: Glucose-Capillary: 124 mg/dL — ABNORMAL HIGH (ref 65–99)

## 2016-02-08 LAB — POCT WET PREP (WET MOUNT)
Clue Cells Wet Prep Whiff POC: NEGATIVE
Trichomonas Wet Prep HPF POC: ABSENT

## 2016-02-08 MED ORDER — FLUCONAZOLE 150 MG PO TABS: 150.0000 mg | ORAL_TABLET | ORAL | 0 refills | Status: DC | PRN

## 2016-02-08 NOTE — Patient Instructions (Signed)
Second Trimester of Pregnancy  The second trimester is from week 13 through week 28, month 4 through 6. This is often the time in pregnancy that you feel your best. Often times, morning sickness has lessened or quit. You may have more energy, and you may get hungry more often. Your unborn baby (fetus) is growing rapidly. At the end of the sixth month, he or she is about 9 inches long and weighs about 1½ pounds. You will likely feel the baby move (quickening) between 18 and 20 weeks of pregnancy.  HOME CARE   · Avoid all smoking, herbs, and alcohol. Avoid drugs not approved by your doctor.  · Do not use any tobacco products, including cigarettes, chewing tobacco, and electronic cigarettes. If you need help quitting, ask your doctor. You may get counseling or other support to help you quit.  · Only take medicine as told by your doctor. Some medicines are safe and some are not during pregnancy.  · Exercise only as told by your doctor. Stop exercising if you start having cramps.  · Eat regular, healthy meals.  · Wear a good support bra if your breasts are tender.  · Do not use hot tubs, steam rooms, or saunas.  · Wear your seat belt when driving.  · Avoid raw meat, uncooked cheese, and liter boxes and soil used by cats.  · Take your prenatal vitamins.  · Take 1500-2000 milligrams of calcium daily starting at the 20th week of pregnancy until you deliver your baby.  · Try taking medicine that helps you poop (stool softener) as needed, and if your doctor approves. Eat more fiber by eating fresh fruit, vegetables, and whole grains. Drink enough fluids to keep your pee (urine) clear or pale yellow.  · Take warm water baths (sitz baths) to soothe pain or discomfort caused by hemorrhoids. Use hemorrhoid cream if your doctor approves.  · If you have puffy, bulging veins (varicose veins), wear support hose. Raise (elevate) your feet for 15 minutes, 3-4 times a day. Limit salt in your diet.  · Avoid heavy lifting, wear low heals,  and sit up straight.  · Rest with your legs raised if you have leg cramps or low back pain.  · Visit your dentist if you have not gone during your pregnancy. Use a soft toothbrush to brush your teeth. Be gentle when you floss.  · You can have sex (intercourse) unless your doctor tells you not to.  · Go to your doctor visits.  GET HELP IF:   · You feel dizzy.  · You have mild cramps or pressure in your lower belly (abdomen).  · You have a nagging pain in your belly area.  · You continue to feel sick to your stomach (nauseous), throw up (vomit), or have watery poop (diarrhea).  · You have bad smelling fluid coming from your vagina.  · You have pain with peeing (urination).  GET HELP RIGHT AWAY IF:   · You have a fever.  · You are leaking fluid from your vagina.  · You have spotting or bleeding from your vagina.  · You have severe belly cramping or pain.  · You lose or gain weight rapidly.  · You have trouble catching your breath and have chest pain.  · You notice sudden or extreme puffiness (swelling) of your face, hands, ankles, feet, or legs.  · You have not felt the baby move in over an hour.  · You have severe headaches that do   not go away with medicine.  · You have vision changes.     This information is not intended to replace advice given to you by your health care provider. Make sure you discuss any questions you have with your health care provider.     Document Released: 09/19/2009 Document Revised: 07/16/2014 Document Reviewed: 08/26/2012  Elsevier Interactive Patient Education ©2016 Elsevier Inc.

## 2016-02-08 NOTE — Progress Notes (Signed)
Sabrina Buckley is a 30 y.o. [id number] at [redacted]w[redacted]d for routine follow up. She has noticed a little more vaginal discharge over the past week. Discharge is white and thin. No malodor. No abdominal pain. No fevers or chills. Tried using some OTC cream which has not helped.   She reports no bleeding, no contractions and no cramping See flow sheet for details.  Subjective: See flow sheet for details Blood pressure 132/81, pulse 90, temperature 98.1 F (36.7 C), weight 193 lb (87.5 kg), last menstrual period 10/25/2015. GU: Normal external genitalia. Scant amount of thick white discharge noted in vaginal vault. No CMT. Gravid uterus.   A/P: Pregnancy at [redacted]w[redacted]d.  Doing well.   Vaginal Candidiasis - Will send in dose of diflucan with extra dose to take in 2-3 days if not improving.  Early 1 hour GTT normal Anatomy ultrasound ordered to be scheduled at 18-19 weeks. Bleeding and pain precautions reviewed. Follow up 4 weeks with me or Dr Cathlean Cower

## 2016-02-28 ENCOUNTER — Encounter (HOSPITAL_COMMUNITY): Payer: Self-pay | Admitting: Family Medicine

## 2016-03-06 ENCOUNTER — Encounter: Payer: Self-pay | Admitting: Family Medicine

## 2016-03-06 ENCOUNTER — Other Ambulatory Visit: Payer: Self-pay | Admitting: Family Medicine

## 2016-03-06 ENCOUNTER — Ambulatory Visit (HOSPITAL_COMMUNITY)
Admission: RE | Admit: 2016-03-06 | Discharge: 2016-03-06 | Disposition: A | Discharge status: Home / Self Care | Payer: Medicaid Other | Source: Ambulatory Visit | Attending: Obstetrics | Admitting: Obstetrics

## 2016-03-06 DIAGNOSIS — O99212 Obesity complicating pregnancy, second trimester: Secondary | ICD-10-CM | POA: Insufficient documentation

## 2016-03-06 DIAGNOSIS — Z36 Encounter for antenatal screening of mother: Secondary | ICD-10-CM | POA: Insufficient documentation

## 2016-03-06 DIAGNOSIS — Z1389 Encounter for screening for other disorder: Secondary | ICD-10-CM

## 2016-03-06 DIAGNOSIS — E669 Obesity, unspecified: Secondary | ICD-10-CM | POA: Diagnosis not present

## 2016-03-06 DIAGNOSIS — Z3A19 19 weeks gestation of pregnancy: Secondary | ICD-10-CM

## 2016-03-06 DIAGNOSIS — Z3492 Encounter for supervision of normal pregnancy, unspecified, second trimester: Secondary | ICD-10-CM

## 2016-03-14 ENCOUNTER — Encounter: Payer: Medicaid Other | Admitting: Family Medicine

## 2016-03-21 ENCOUNTER — Ambulatory Visit (INDEPENDENT_AMBULATORY_CARE_PROVIDER_SITE_OTHER): Payer: Medicaid Other | Admitting: Family Medicine

## 2016-03-21 DIAGNOSIS — Z3481 Encounter for supervision of other normal pregnancy, first trimester: Secondary | ICD-10-CM

## 2016-03-21 NOTE — Patient Instructions (Signed)
Please come back in 4 weeks. We will schedule your ultrasound at that time.  You will need to meet with Dr Shawnie PonsPratt when you are 30-32 weeks. This will be in 9-11 weeks.   Second Trimester of Pregnancy The second trimester is from week 13 through week 28, month 4 through 6. This is often the time in pregnancy that you feel your best. Often times, morning sickness has lessened or quit. You may have more energy, and you may get hungry more often. Your unborn baby (fetus) is growing rapidly. At the end of the sixth month, he or she is about 9 inches long and weighs about 1 pounds. You will likely feel the baby move (quickening) between 18 and 20 weeks of pregnancy. HOME CARE   Avoid all smoking, herbs, and alcohol. Avoid drugs not approved by your doctor.  Do not use any tobacco products, including cigarettes, chewing tobacco, and electronic cigarettes. If you need help quitting, ask your doctor. You may get counseling or other support to help you quit.  Only take medicine as told by your doctor. Some medicines are safe and some are not during pregnancy.  Exercise only as told by your doctor. Stop exercising if you start having cramps.  Eat regular, healthy meals.  Wear a good support bra if your breasts are tender.  Do not use hot tubs, steam rooms, or saunas.  Wear your seat belt when driving.  Avoid raw meat, uncooked cheese, and liter boxes and soil used by cats.  Take your prenatal vitamins.  Take 1500-2000 milligrams of calcium daily starting at the 20th week of pregnancy until you deliver your baby.  Try taking medicine that helps you poop (stool softener) as needed, and if your doctor approves. Eat more fiber by eating fresh fruit, vegetables, and whole grains. Drink enough fluids to keep your pee (urine) clear or pale yellow.  Take warm water baths (sitz baths) to soothe pain or discomfort caused by hemorrhoids. Use hemorrhoid cream if your doctor approves.  If you have puffy,  bulging veins (varicose veins), wear support hose. Raise (elevate) your feet for 15 minutes, 3-4 times a day. Limit salt in your diet.  Avoid heavy lifting, wear low heals, and sit up straight.  Rest with your legs raised if you have leg cramps or low back pain.  Visit your dentist if you have not gone during your pregnancy. Use a soft toothbrush to brush your teeth. Be gentle when you floss.  You can have sex (intercourse) unless your doctor tells you not to.  Go to your doctor visits. GET HELP IF:   You feel dizzy.  You have mild cramps or pressure in your lower belly (abdomen).  You have a nagging pain in your belly area.  You continue to feel sick to your stomach (nauseous), throw up (vomit), or have watery poop (diarrhea).  You have bad smelling fluid coming from your vagina.  You have pain with peeing (urination). GET HELP RIGHT AWAY IF:   You have a fever.  You are leaking fluid from your vagina.  You have spotting or bleeding from your vagina.  You have severe belly cramping or pain.  You lose or gain weight rapidly.  You have trouble catching your breath and have chest pain.  You notice sudden or extreme puffiness (swelling) of your face, hands, ankles, feet, or legs.  You have not felt the baby move in over an hour.  You have severe headaches that do not go  away with medicine.  You have vision changes.   This information is not intended to replace advice given to you by your health care provider. Make sure you discuss any questions you have with your health care provider.   Document Released: 09/19/2009 Document Revised: 07/16/2014 Document Reviewed: 08/26/2012 Elsevier Interactive Patient Education [employer]

## 2016-03-21 NOTE — Progress Notes (Signed)
Sabrina Buckley is a 30 y.o. [id number] at 4033w1d for routine follow up.  She reports no complaints today. No bleeding, contractions, or cramping. +FM.  See flow sheet for details.  A/P: Pregnancy at 6533w1d.  Doing well.   Anatomy scan reviewed, problems are not noted. Female. Recommended repeat scan in 6-8 weeks given maternal obesity. Will schedule at next appointment. Preterm labor precautions reviewed. Follow up 4 weeks. Patient desires TOLAC. Will need to meet with Dr Shawnie PonsPratt at 30-32 weeks.   Katina Degreealeb M. Jimmey RalphParker, MD [facility] Resident PGY-3 03/21/2016 3:09 PM

## 2016-04-20 ENCOUNTER — Ambulatory Visit (INDEPENDENT_AMBULATORY_CARE_PROVIDER_SITE_OTHER): Payer: Medicaid Other | Admitting: Internal Medicine

## 2016-04-20 VITALS — BP 117/72 | HR 76 | Temp 98.1°F | Wt 194.0 lb

## 2016-04-20 DIAGNOSIS — Z3482 Encounter for supervision of other normal pregnancy, second trimester: Secondary | ICD-10-CM | POA: Diagnosis present

## 2016-04-20 LAB — GLUCOSE, CAPILLARY
Comment 1: 1
Glucose-Capillary: 158 mg/dL — ABNORMAL HIGH (ref 65–99)
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
## 2016-04-20 NOTE — Patient Instructions (Addendum)

## 2016-04-25 ENCOUNTER — Telehealth: Payer: Self-pay | Admitting: Family Medicine

## 2016-04-25 ENCOUNTER — Other Ambulatory Visit: Payer: Medicaid Other

## 2016-04-25 DIAGNOSIS — Z3480 Encounter for supervision of other normal pregnancy, unspecified trimester: Secondary | ICD-10-CM

## 2016-04-25 LAB — GLUCOSE, CAPILLARY: Glucose-Capillary: 90 mg/dL (ref 65–99)

## 2016-04-25 NOTE — Telephone Encounter (Signed)
Patient asks PCP for a note about her not being able to lift any weight (patient works in a nursing home). Please, follow up.

## 2016-04-26 LAB — GLUCOSE TOLERANCE, 3 HOURS
GLUCOSE, FASTING-GESTATIONAL: 80 mg/dL (ref 65–104)
Glucose Tolerance, 1 hour: 144 mg/dL (ref <190)
Glucose Tolerance, 2 hour: 109 mg/dL (ref <165)
Glucose, GTT - 3 Hour: 102 mg/dL (ref <145)

## 2016-04-27 ENCOUNTER — Encounter: Payer: Self-pay | Admitting: Internal Medicine

## 2016-04-30 ENCOUNTER — Other Ambulatory Visit: Payer: Self-pay | Admitting: Internal Medicine

## 2016-04-30 ENCOUNTER — Ambulatory Visit (HOSPITAL_COMMUNITY)
Admission: RE | Admit: 2016-04-30 | Discharge: 2016-04-30 | Disposition: A | Discharge status: Home / Self Care | Payer: Medicaid Other | Source: Ambulatory Visit | Attending: Family Medicine | Admitting: Family Medicine

## 2016-04-30 DIAGNOSIS — O34219 Maternal care for unspecified type scar from previous cesarean delivery: Secondary | ICD-10-CM | POA: Diagnosis not present

## 2016-04-30 DIAGNOSIS — O99212 Obesity complicating pregnancy, second trimester: Secondary | ICD-10-CM | POA: Insufficient documentation

## 2016-04-30 DIAGNOSIS — Z3482 Encounter for supervision of other normal pregnancy, second trimester: Secondary | ICD-10-CM

## 2016-04-30 DIAGNOSIS — Z362 Encounter for other antenatal screening follow-up: Secondary | ICD-10-CM | POA: Insufficient documentation

## 2016-04-30 DIAGNOSIS — Z98891 History of uterine scar from previous surgery: Secondary | ICD-10-CM

## 2016-04-30 DIAGNOSIS — Z3A26 26 weeks gestation of pregnancy: Secondary | ICD-10-CM | POA: Diagnosis not present

## 2016-04-30 DIAGNOSIS — Z8751 Personal history of pre-term labor: Secondary | ICD-10-CM

## 2016-04-30 DIAGNOSIS — O09212 Supervision of pregnancy with history of pre-term labor, second trimester: Secondary | ICD-10-CM | POA: Insufficient documentation

## 2016-04-30 NOTE — Telephone Encounter (Signed)
Please let patient know that letter is up front and she can come pick it up as needed.

## 2016-05-18 ENCOUNTER — Ambulatory Visit (INDEPENDENT_AMBULATORY_CARE_PROVIDER_SITE_OTHER): Payer: Medicaid Other | Admitting: Internal Medicine

## 2016-05-18 VITALS — BP 116/66 | HR 94 | Temp 98.4°F | Wt 194.0 lb

## 2016-05-18 DIAGNOSIS — Z23 Encounter for immunization: Secondary | ICD-10-CM

## 2016-05-18 DIAGNOSIS — Z3480 Encounter for supervision of other normal pregnancy, unspecified trimester: Secondary | ICD-10-CM

## 2016-05-18 NOTE — Progress Notes (Signed)
Alta CorningDominique N Buckley is a 30 y.o. [id number] at 927w3d for routine follow up.  She reports no vaginal bleeding, no loss of fluid, no contractions. + fetal movement. Doing well. No complaints.  See flow sheet for details.  Uterine size 29 cm  FHR: 145 bpm   A/P: Pregnancy at 757w3d.  Doing well.   Pregnancy issues include no issues   Infant feeding choice: Breastfeeding  Contraception choice: Nexplanon Infant circumcision desired not applicable   Tdapwas given today and Flu shot was given today  CBC, RPR, and HIV were done today. 1 hour/ 3 hour glucola were completed at previous visit  Pregnancy medical home forms were done today and reviewed.   RH status was reviewed and pt does not need Rhogam.  Rhogam was not given today.  Appointment made with Dr. Shawnie PonsPratt for TOLAC on 06/18/2016  Childbirth and education classes were not offered. Preterm labor precautions reviewed. Kick counts reviewed. Follow up 2 weeks - OB clinic at next visit

## 2016-05-18 NOTE — Patient Instructions (Addendum)
Your appointment to discuss laboring following a C-section is on  06/18/2016 @ 1:30 with Dr. Shawnie PonsPratt. - You will come to this clinic for that appointment    Third Trimester of Pregnancy The third trimester is from week 29 through week 42, months 7 through 9. This trimester is when your unborn baby (fetus) is growing very fast. At the end of the ninth month, the unborn baby is about 20 inches in length. It weighs about 6-10 pounds.  HOME CARE   Avoid all smoking, herbs, and alcohol. Avoid drugs not approved by your doctor.  Do not use any tobacco products, including cigarettes, chewing tobacco, and electronic cigarettes. If you need help quitting, ask your doctor. You may get counseling or other support to help you quit.  Only take medicine as told by your doctor. Some medicines are safe and some are not during pregnancy.  Exercise only as told by your doctor. Stop exercising if you start having cramps.  Eat regular, healthy meals.  Wear a good support bra if your breasts are tender.  Do not use hot tubs, steam rooms, or saunas.  Wear your seat belt when driving.  Avoid raw meat, uncooked cheese, and liter boxes and soil used by cats.  Take your prenatal vitamins.  Take 1500-2000 milligrams of calcium daily starting at the 20th week of pregnancy until you deliver your baby.  Try taking medicine that helps you poop (stool softener) as needed, and if your doctor approves. Eat more fiber by eating fresh fruit, vegetables, and whole grains. Drink enough fluids to keep your pee (urine) clear or pale yellow.  Take warm water baths (sitz baths) to soothe pain or discomfort caused by hemorrhoids. Use hemorrhoid cream if your doctor approves.  If you have puffy, bulging veins (varicose veins), wear support hose. Raise (elevate) your feet for 15 minutes, 3-4 times a day. Limit salt in your diet.  Avoid heavy lifting, wear low heels, and sit up straight.  Rest with your legs raised if you have  leg cramps or low back pain.  Visit your dentist if you have not gone during your pregnancy. Use a soft toothbrush to brush your teeth. Be gentle when you floss.  You can have sex (intercourse) unless your doctor tells you not to.  Do not travel far distances unless you must. Only do so with your doctor's approval.  Take prenatal classes.  Practice driving to the hospital.  Pack your hospital bag.  Prepare the baby's room.  Go to your doctor visits. GET HELP IF:  You are not sure if you are in labor or if your water has broken.  You are dizzy.  You have mild cramps or pressure in your lower belly (abdominal).  You have a nagging pain in your belly area.  You continue to feel sick to your stomach (nauseous), throw up (vomit), or have watery poop (diarrhea).  You have bad smelling fluid coming from your vagina.  You have pain with peeing (urination). GET HELP RIGHT AWAY IF:   You have a fever.  You are leaking fluid from your vagina.  You are spotting or bleeding from your vagina.  You have severe belly cramping or pain.  You lose or gain weight rapidly.  You have trouble catching your breath and have chest pain.  You notice sudden or extreme puffiness (swelling) of your face, hands, ankles, feet, or legs.  You have not felt the baby move in over an hour.  You have severe  headaches that do not go away with medicine.  You have vision changes.   This information is not intended to replace advice given to you by your health care provider. Make sure you discuss any questions you have with your health care provider.   Document Released: 09/19/2009 Document Revised: 07/16/2014 Document Reviewed: 08/26/2012 Elsevier Interactive Patient Education 2016 [employer] Fetal Movement Counts Patient Name: __________________________________________________ Patient Due Date: ____________________ Performing a fetal movement count is highly recommended in high-risk  pregnancies, but it is good for every pregnant woman to do. Your health care provider may ask you to start counting fetal movements at 28 weeks of the pregnancy. Fetal movements often increase:  After eating a full meal.  After physical activity.  After eating or drinking something sweet or cold.  At rest. Pay attention to when you feel the baby is most active. This  ll help you notice a pattern of your baby's sleep and wake cycles and what factors contribute to an increase in fetal movement. It is important to perform a fetal movement count at the same time each day when your baby is normally most active.  HOW TO COUNT FETAL MOVEMENTS 1. Find a quiet and comfortable area to sit or lie down on your left side. Lying on your left side provides the best blood and oxygen circulation to your baby. 2. Write down the day and time on a sheet of paper or in a journal. 3. Start counting kicks, flutters, swishes, rolls, or jabs in a 2-hour period. You should feel at least 10 movements within 2 hours. 4. If you do not feel 10 movements in 2 hours, wait 2-3 hours and count again. Look for a change in the pattern or not enough counts in 2 hours. SEEK MEDICAL CARE IF:  You feel less than 10 counts in 2 hours, tried twice.  There is no movement in over an hour.  The pattern is changing or taking longer each day to reach 10 counts in 2 hours.  You feel the baby is not moving as he or she usually does. Date: ____________ Movements: ____________ Start time: ____________ Doreatha Martin time: ____________  Date: ____________ Movements: ____________ Start time: ____________ Doreatha Martin time: ____________ Date: ____________ Movements: ____________ Start time: ____________ Doreatha Martin time: ____________ Date: ____________ Movements: ____________ Start time: ____________ Doreatha Martin time: ____________ Date: ____________ Movements: ____________ Start time: ____________ Doreatha Martin time: ____________ Date: ____________ Movements:  ____________ Start time: ____________ Doreatha Martin time: ____________ Date: ____________ Movements: ____________ Start time: ____________ Doreatha Martin time: ____________ Date: ____________ Movements: ____________ Start time: ____________ Doreatha Martin time: ____________  Date: ____________ Movements: ____________ Start time: ____________ Doreatha Martin time: ____________ Date: ____________ Movements: ____________ Start time: ____________ Doreatha Martin time: ____________ Date: ____________ Movements: ____________ Start time: ____________ Doreatha Martin time: ____________ Date: ____________ Movements: ____________ Start time: ____________ Doreatha Martin time: ____________ Date: ____________ Movements: ____________ Start time: ____________ Doreatha Martin time: ____________ Date: ____________ Movements: ____________ Start time: ____________ Doreatha Martin time: ____________ Date: ____________ Movements: ____________ Start time: ____________ Doreatha Martin time: ____________  Date: ____________ Movements: ____________ Start time: ____________ Doreatha Martin time: ____________ Date: ____________ Movements: ____________ Start time: ____________ Doreatha Martin time: ____________ Date: ____________ Movements: ____________ Start time: ____________ Doreatha Martin time: ____________ Date: ____________ Movements: ____________ Start time: ____________ Doreatha Martin time: ____________ Date: ____________ Movements: ____________ Start time: ____________ Doreatha Martin time: ____________ Date: ____________ Movements: ____________ Start time: ____________ Doreatha Martin time: ____________ Date: ____________ Movements: ____________ Start time: ____________ Doreatha Martin time: ____________  Date: ____________ Movements: ____________ Start time: ____________ Doreatha Martin time: ____________ Date: ____________ Movements: ____________ Start time:  ____________ Doreatha MartinFinish time: ____________ Date: ____________ Movements: ____________ Start time: ____________ Doreatha MartinFinish time: ____________ Date: ____________ Movements: ____________ Start time: ____________ Doreatha MartinFinish  time: ____________ Date: ____________ Movements: ____________ Start time: ____________ Doreatha MartinFinish time: ____________ Date: ____________ Movements: ____________ Start time: ____________ Doreatha MartinFinish time: ____________ Date: ____________ Movements: ____________ Start time: ____________ Doreatha MartinFinish time: ____________  Date: ____________ Movements: ____________ Start time: ____________ Doreatha MartinFinish time: ____________ Date: ____________ Movements: ____________ Start time: ____________ Doreatha MartinFinish time: ____________ Date: ____________ Movements: ____________ Start time: ____________ Doreatha MartinFinish time: ____________ Date: ____________ Movements: ____________ Start time: ____________ Doreatha MartinFinish time: ____________ Date: ____________ Movements: ____________ Start time: ____________ Doreatha MartinFinish time: ____________ Date: ____________ Movements: ____________ Start time: ____________ Doreatha MartinFinish time: ____________ Date: ____________ Movements: ____________ Start time: ____________ Doreatha MartinFinish time: ____________  Date: ____________ Movements: ____________ Start time: ____________ Doreatha MartinFinish time: ____________ Date: ____________ Movements: ____________ Start time: ____________ Doreatha MartinFinish time: ____________ Date: ____________ Movements: ____________ Start time: ____________ Doreatha MartinFinish time: ____________ Date: ____________ Movements: ____________ Start time: ____________ Doreatha MartinFinish time: ____________ Date: ____________ Movements: ____________ Start time: ____________ Doreatha MartinFinish time: ____________ Date: ____________ Movements: ____________ Start time: ____________ Doreatha MartinFinish time: ____________ Date: ____________ Movements: ____________ Start time: ____________ Doreatha MartinFinish time: ____________  Date: ____________ Movements: ____________ Start time: ____________ Doreatha MartinFinish time: ____________ Date: ____________ Movements: ____________ Start time: ____________ Doreatha MartinFinish time: ____________ Date: ____________ Movements: ____________ Start time: ____________ Doreatha MartinFinish time: ____________ Date: ____________  Movements: ____________ Start time: ____________ Doreatha MartinFinish time: ____________ Date: ____________ Movements: ____________ Start time: ____________ Doreatha MartinFinish time: ____________ Date: ____________ Movements: ____________ Start time: ____________ Doreatha MartinFinish time: ____________ Date: ____________ Movements: ____________ Start time: ____________ Doreatha MartinFinish time: ____________  Date: ____________ Movements: ____________ Start time: ____________ Doreatha MartinFinish time: ____________ Date: ____________ Movements: ____________ Start time: ____________ Doreatha MartinFinish time: ____________ Date: ____________ Movements: ____________ Start time: ____________ Doreatha MartinFinish time: ____________ Date: ____________ Movements: ____________ Start time: ____________ Doreatha MartinFinish time: ____________ Date: ____________ Movements: ____________ Start time: ____________ Doreatha MartinFinish time: ____________ Date: ____________ Movements: ____________ Start time: ____________ Doreatha MartinFinish time: ____________   This information is not intended to replace advice given to you by your health care provider. Make sure you discuss any questions you have with your health care provider.   Document Released: 07/25/2006 Document Revised: 07/16/2014 Document Reviewed: 04/21/2012 Else[employer] Interactive Patient Education [employer]2016 [employer]

## 2016-05-19 LAB — CBC
HCT: 28.6 % — ABNORMAL LOW (ref 35.0–45.0)
Hemoglobin: 9.3 g/dL — ABNORMAL LOW (ref 11.7–15.5)
MCH: 29.3 pg (ref 27.0–33.0)
MCHC: 32.5 g/dL (ref 32.0–36.0)
MCV: 90.2 fL (ref 80.0–100.0)
MPV: 8.9 fL (ref 7.5–12.5)
Platelets: 246 K/uL (ref 140–400)
RBC: 3.17 MIL/uL — ABNORMAL LOW (ref 3.80–5.10)
RDW: 14 % (ref 11.0–15.0)
WBC: 6.5 K/uL (ref 3.8–10.8)

## 2016-05-19 LAB — SYPHILIS: RPR W/REFLEX TO RPR TITER AND TREPONEMAL ANTIBODIES, TRADITIONAL SCREENING AND DIAGNOSIS ALGORITHM

## 2016-05-19 LAB — HIV ANTIBODY (ROUTINE TESTING W REFLEX): HIV 1&2 Ab, 4th Generation: NONREACTIVE

## 2016-05-24 ENCOUNTER — Ambulatory Visit (HOSPITAL_COMMUNITY): Payer: Medicaid Other | Attending: Family Medicine

## 2016-05-24 ENCOUNTER — Ambulatory Visit (INDEPENDENT_AMBULATORY_CARE_PROVIDER_SITE_OTHER): Payer: Medicaid Other | Admitting: Family Medicine

## 2016-05-24 ENCOUNTER — Other Ambulatory Visit: Payer: Self-pay

## 2016-05-24 ENCOUNTER — Other Ambulatory Visit (HOSPITAL_COMMUNITY): Payer: Medicaid Other

## 2016-05-24 VITALS — BP 110/60 | HR 110 | Temp 98.4°F | Wt 194.0 lb

## 2016-05-24 DIAGNOSIS — R0602 Shortness of breath: Secondary | ICD-10-CM

## 2016-05-24 DIAGNOSIS — Z3480 Encounter for supervision of other normal pregnancy, unspecified trimester: Secondary | ICD-10-CM

## 2016-05-24 MED ORDER — DOCUSATE SODIUM 100 MG PO CAPS: 100.0000 mg | ORAL_CAPSULE | Freq: Two times a day (BID) | ORAL | 3 refills | Status: DC

## 2016-05-24 MED ORDER — FERROUS SULFATE 325 (65 FE) MG PO TABS
325.0000 mg | ORAL_TABLET | Freq: Two times a day (BID) | ORAL | 3 refills | Status: DC
Start: 2016-05-24 — End: 2016-10-16

## 2016-05-24 NOTE — Patient Instructions (Signed)
Increase dietary fiber if you get constipated from the iron Call us if you need a prescription for something to help with constipation  Work on eating regular meals - nutrition is important for your baby Also work on quitting smoking - this is important!  Go to MAU if shortness of breath or chest pain returns. EKG looks good today.  If you have any cramping/contractions, vaginal bleeding, fluid leaking, or are worried that baby is not moving normally, [facility] to [facility].   Follow up in 2 weeks with Dr. Cathlean CowerMikell Keep appointment with Dr. Shawnie PonsPratt to discuss vaginal birth plan and tubal ligation  Be well, Dr. Pollie MeyerMcIntyre    Third Trimester of Pregnancy The third trimester is from week 29 through week 40 (months 7 through 9). The third trimester is a time when the unborn baby (fetus) is growing rapidly. At the end of the ninth month, the fetus is about 20 inches in length and weighs 6-10 pounds. Body changes during your third trimester Your body goes through many changes during pregnancy. The changes vary from woman to woman. During the third trimester:  Your weight will continue to increase. You can expect to gain 25-35 pounds (11-16 kg) by the end of the pregnancy.  You may begin to get stretch marks on your hips, abdomen, and breasts.  You may urinate more often because the fetus is moving lower into your pelvis and pressing on your bladder.  You may develop or continue to have heartburn. This is caused by increased hormones that slow down muscles in the digestive tract.  You may develop or continue to have constipation because increased hormones slow digestion and cause the muscles that push waste through your intestines to relax.  You may develop hemorrhoids. These are swollen veins (varicose veins) in the rectum that can itch or be painful.  You may develop swollen, bulging veins (varicose veins) in your legs.  You may have increased body aches in the  pelvis, back, or thighs. This is due to weight gain and increased hormones that are relaxing your joints.  You may have changes in your hair. These can include thickening of your hair, rapid growth, and changes in texture. Some women also have hair loss during or after pregnancy, or hair that feels dry or thin. Your hair will most likely return to normal after your baby is born.  Your breasts will continue to grow and they will continue to become tender. A yellow fluid (colostrum) may leak from your breasts. This is the first milk you are producing for your baby.  Your belly button may stick out.  You may notice more swelling in your hands, face, or ankles.  You may have increased tingling or numbness in your hands, arms, and legs. The skin on your belly may also feel numb.  You may feel short of breath because of your expanding uterus.  You may have more problems sleeping. This can be caused by the size of your belly, increased need to urinate, and an increase in your body's metabolism.  You may notice the fetus "dropping," or moving lower in your abdomen.  You may have increased vaginal discharge.  Your cervix becomes thin and soft (effaced) near your due date. What to expect at prenatal visits You will have prenatal exams every 2 weeks until week 36. Then you will have weekly prenatal exams. During a routine prenatal visit:  You will be weighed to make sure you and the fetus  are growing normally.  Your blood pressure will be taken.  Your abdomen will be measured to track your baby's growth.  The fetal heartbeat will be listened to.  Any test results from the previous visit will be discussed.  You may have a cervical check near your due date to see if you have effaced. At around 36 weeks, your health care provider will check your cervix. At the same time, your health care provider will also perform a test on the secretions of the vaginal tissue. This test is to determine if a  type of bacteria, Group B streptococcus, is present. Your health care provider will explain this further. Your health care provider may ask you:  What your birth plan is.  How you are feeling.  If you are feeling the baby move.  If you have had any abnormal symptoms, such as leaking fluid, bleeding, severe headaches, or abdominal cramping.  If you are using any tobacco products, including cigarettes, chewing tobacco, and electronic cigarettes.  If you have any questions. Other tests or screenings that may be performed during your third trimester include:  Blood tests that check for low iron levels (anemia).  Fetal testing to check the health, activity level, and growth of the fetus. Testing is done if you have certain medical conditions or if there are problems during the pregnancy.  Nonstress test (NST). This test checks the health of your baby to make sure there are no signs of problems, such as the baby not getting enough oxygen. During this test, a belt is placed around your belly. The baby is made to move, and its heart rate is monitored during movement. What is false labor? False labor is a condition in which you feel small, irregular tightenings of the muscles in the womb (contractions) that eventually go away. These are called Braxton Hicks contractions. Contractions may last for hours, days, or even weeks before true labor sets in. If contractions come at regular intervals, become more frequent, increase in intensity, or become painful, you should see your health care provider. What are the signs of labor?  Abdominal cramps.  Regular contractions that start at 10 minutes apart and become stronger and more frequent with time.  Contractions that start on the top of the uterus and spread down to the lower abdomen and back.  Increased pelvic pressure and dull back pain.  A watery or bloody mucus discharge that comes from the vagina.  Leaking of amniotic fluid. This is also  known as your "water breaking." It could be a slow trickle or a gush. Let your doctor know if it has a color or strange odor. If you have any of these signs, call your health care provider right away, even if it is before your due date. Follow these instructions at home: Eating and drinking  Continue to eat regular, healthy meals.  Do not eat:  Raw meat or meat spreads.  Unpasteurized milk or cheese.  Unpasteurized juice.  Store-made salad.  Refrigerated smoked seafood.  Hot dogs or deli meat, unless they are piping hot.  More than 6 ounces of albacore tuna a week.  Shark, swordfish, king mackerel, or tile fish.  Store-made salads.  Raw sprouts, such as mung bean or alfalfa sprouts.  Take prenatal vitamins as told by your health care provider.  Take 1000 mg of calcium daily as told by your health care provider.  If you develop constipation:  Take over-the-counter or prescription medicines.  Drink enough fluid to keep  your urine clear or pale yellow.  Eat foods that are high in fiber, such as fresh fruits and vegetables, whole grains, and beans.  Limit foods that are high in fat and processed sugars, such as fried and sweet foods. Activity  Exercise only as directed by your health care provider. Healthy pregnant women should aim for 2 hours and 30 minutes of moderate exercise per week. If you experience any pain or discomfort while exercising, stop.  Avoid heavy lifting.  Do not exercise in extreme heat or humidity, or at high altitudes.  Wear low-heel, comfortable shoes.  Practice good posture.  Do not travel far distances unless it is absolutely necessary and only with the approval of your health care provider.  Wear your seat belt at all times while in a car, on a bus, or on a plane.  Take frequent breaks and rest with your legs elevated if you have leg cramps or low back pain.  Do not use hot tubs, steam rooms, or saunas.  You may continue to have sex  unless your health care provider tells you otherwise. Lifestyle  Do not use any products that contain nicotine or tobacco, such as cigarettes and e-cigarettes. If you need help quitting, ask your health care provider.  Do not drink alcohol.  Do not use any medicinal herbs or unprescribed drugs. These chemicals affect the formation and growth of the baby.  If you develop varicose veins:  Wear support pantyhose or compression stockings as told by your healthcare provider.  Elevate your feet for 15 minutes, 3-4 times a day.  Wear a supportive maternity bra to help with breast tenderness. General instructions  Take over-the-counter and prescription medicines only as told by your health care provider. There are medicines that are either safe or unsafe to take during pregnancy.  Take warm sitz baths to soothe any pain or discomfort caused by hemorrhoids. Use hemorrhoid cream or witch hazel if your health care provider approves.  Avoid cat litter boxes and soil used by cats. These carry germs that can cause birth defects in the baby. If you have a cat, ask someone to clean the litter box for you.  To prepare for the arrival of your baby:  Take prenatal classes to understand, practice, and ask questions about the labor and delivery.  Make a trial run to the hospital.  Visit the hospital and tour the maternity area.  Arrange for maternity or paternity leave through employers.  Arrange for family and friends to take care of pets while you are in the hospital.  Purchase a rear-facing car seat and make sure you know how to install it in your car.  Pack your hospital bag.  Prepare the baby's nursery. Make sure to remove all pillows and stuffed animals from the baby's crib to prevent suffocation.  Visit your dentist if you have not gone during your pregnancy. Use a soft toothbrush to brush your teeth and be gentle when you floss.  Keep all prenatal follow-up visits as told by your  health care provider. This is important. Contact a health care provider if:  You are unsure if you are in labor or if your water has broken.  You become dizzy.  You have mild pelvic cramps, pelvic pressure, or nagging pain in your abdominal area.  You have lower back pain.  You have persistent nausea, vomiting, or diarrhea.  You have an unusual or bad smelling vaginal discharge.  You have pain when you urinate. Get help right  away if:  You have a fever.  You are leaking fluid from your vagina.  You have spotting or bleeding from your vagina.  You have severe abdominal pain or cramping.  You have rapid weight loss or weight gain.  You have shortness of breath with chest pain.  You notice sudden or extreme swelling of your face, hands, ankles, feet, or legs.  Your baby makes fewer than 10 movements in 2 hours.  You have severe headaches that do not go away with medicine.  You have vision changes. Summary  The third trimester is from week 29 through week 40, months 7 through 9. The third trimester is a time when the unborn baby (fetus) is growing rapidly.  During the third trimester, your discomfort may increase as you and your baby continue to gain weight. You may have abdominal, leg, and back pain, sleeping problems, and an increased need to urinate.  During the third trimester your breasts will keep growing and they will continue to become tender. A yellow fluid (colostrum) may leak from your breasts. This is the first milk you are producing for your baby.  False labor is a condition in which you feel small, irregular tightenings of the muscles in the womb (contractions) that eventually go away. These are called Braxton Hicks contractions. Contractions may last for hours, days, or even weeks before true labor sets in.  Signs of labor can include: abdominal cramps; regular contractions that start at 10 minutes apart and become stronger and more frequent with time; watery  or bloody mucus discharge that comes from the vagina; increased pelvic pressure and dull back pain; and leaking of amniotic fluid. This information is not intended to replace advice given to you by your health care provider. Make sure you discuss any questions you have with your health care provider. Document Released: 06/19/2001 Document Revised: 12/01/2015 Document Reviewed: 08/26/2012 Elsevier Interactive Patient Education  2017 [employer].

## 2016-05-24 NOTE — Progress Notes (Signed)
28-30 WEEKS  Sabrina Buckley is a 30 y.o. [id number] at 2513w2d here for routine follow up.  She reports shortness of breath/chest tightness last night that lasted for 20 minutes while standing up fixing a salad in her kitchen. Notes that she did not drink much water yesterday and she also regularly drinks a lot of soda. May have felt some palpitations at this time as well. Denies hx of asthma, allergies. Denies VB, endorses thin clear discharge with coughing or gagging, good FM, no CTX.  See flow sheet for details.  Prior OB history: of note, first term delivery was after bedrest for cervical dilation to 3cm around "6 months." Second delivery was around 35 weeks for fetal arrhythmia, [facility]. This child followed up with cardiology and on propranolol, but now not on meds.   Physical exam: O2 sat 98% on RA, BP 110/60, HR 110.  General: well appearing, NAD.  CV: tachycardic, regular rhythm Resp: CTAB no increased WOB Abdomen: gravid uterus, fundal height 30cm. FHR 155.   A/P: Pregnancy at 713w2d.  Doing well.   Pregnancy issues include obesity, tobacco use, hx of [facility]), anemia.   Tobacco use - down to 1-2 cigarettes per day after work. Gum is helping during the day. Understands risks of LBW and PTL.   Anemia - rx'd ferrous sulfate 325 BID along with colace BID with counseling about the importance of taking these.   Thin vaginal discharge - based on history of presence with valsalva and this being 3rd pregnancy, likely urinary incontinence. Counseled patient that she should go to the MAU if this happens when not coughing or increases in volume.   Decreased PO intake - has not had a good appetite  Shortness of breath - EKG without acute concerns, appears NSR. Counseled the patient to go to MAU if this reoccurs and to drink more water as well as less soda.   Infant feeding choice: breast Contraception choice: nexplanon Infant  circumcision desired not applicable  Tdap and flu given last visit.  1 hour glucola failed/3 hr GTTpassed, CBC, RPR, and HIV done last visit.  Pregnancy medical home not done and PHQ-9 (score 5) forms were done today and reviewed.   Rh status was reviewed and patient does not need Rhogam.  Rhogam was not given today.   Childbirth and education classes were not offered. Preterm labor and fetal movement precautions reviewed. Follow up 2 weeks. App[facility] already [facility]

## 2016-05-25 ENCOUNTER — Encounter: Payer: Self-pay | Admitting: Family Medicine

## 2016-05-28 ENCOUNTER — Ambulatory Visit (INDEPENDENT_AMBULATORY_CARE_PROVIDER_SITE_OTHER): Payer: Medicaid Other | Admitting: Family Medicine

## 2016-05-28 ENCOUNTER — Encounter: Payer: Self-pay | Admitting: Family Medicine

## 2016-05-28 VITALS — BP 112/64 | HR 87 | Temp 98.4°F | Ht 63.0 in | Wt 197.6 lb

## 2016-05-28 DIAGNOSIS — J069 Acute upper respiratory infection, unspecified: Secondary | ICD-10-CM

## 2016-05-28 NOTE — Patient Instructions (Signed)
Thank you so much for coming to visit today! Your symptoms are most likely related to a virus. Antibiotics are not recommended at this time. I will give you a list of medications that are safe during pregnancy. If you develop worsening symptoms or a fever, please let us know.  Dr. Caroleen Hammanumley

## 2016-05-29 NOTE — Progress Notes (Signed)
Subjective:     Patient ID: Sabrina Buckley, female   DOB: 07/12/1985, 30 y.o.   MRN: [id number]  HPI Sabrina Buckley is a 30yo female presenting today for cold symptoms. Currently 30weeks 6days pregnant. - Reports productive cough, sneezing, runny nose, congestion - Symptoms for one day - Denies sick contacts - Was scared to take medications given pregnancy - Denies vaginal bleeding, discharge, contractions, loss of fluid.  - Notes normal fetal movement  Review of Systems Per HPI    Objective:   Physical Exam  Constitutional: She appears well-developed and well-nourished. No distress.  HENT:  Head: Normocephalic and atraumatic.  Mouth/Throat: Oropharynx is clear and moist. No oropharyngeal exudate.  Cardiovascular: Normal rate and regular rhythm.   No murmur heard. Pulmonary/Chest: Effort normal. No respiratory distress. She has no wheezes.  Musculoskeletal: She exhibits no edema.  Psychiatric: She has a normal mood and affect. Her behavior is normal.       Assessment and Plan:     1. Acute upper respiratory infection - Suspect viral etiology. Antibiotics not indicated. - Handout concerning safe over the counter medications for cold symptoms given.  - Follow up as scheduled for next prenatal visit. Return if symptoms worsen.

## 2016-06-07 ENCOUNTER — Telehealth: Payer: Self-pay | Admitting: *Deleted

## 2016-06-07 ENCOUNTER — Ambulatory Visit (INDEPENDENT_AMBULATORY_CARE_PROVIDER_SITE_OTHER): Payer: Medicaid Other | Admitting: Internal Medicine

## 2016-06-07 VITALS — BP 99/56 | HR 89 | Temp 97.9°F | Wt 190.4 lb

## 2016-06-07 DIAGNOSIS — Z3480 Encounter for supervision of other normal pregnancy, unspecified trimester: Secondary | ICD-10-CM

## 2016-06-07 MED ORDER — GINGER-PYRIDOXINE 325-25 MG PO TABS: 1.0000 | ORAL_TABLET | Freq: Three times a day (TID) | ORAL | 1 refills | Status: DC | PRN

## 2016-06-07 MED ORDER — PYRIDOXINE HCL 25 MG PO TABS: 25.0000 mg | ORAL_TABLET | Freq: Three times a day (TID) | ORAL | 0 refills | Status: DC | PRN

## 2016-06-07 NOTE — Telephone Encounter (Signed)
Received fax from [facility] stating Ginger-pyridoxine 325-25 mg are not available. Please advise.  Kinnie FeilL. Ducatte, RN, BSN

## 2016-06-07 NOTE — Patient Instructions (Signed)
Please follow up in two weeks. I would recommend taking the Boost supplements in between morning and evening meal. Continue to try to improve eating. I have prescribed you a medication for your nausea. Continue to try to quite smoking. Follow up in two weeks   Braxton Hicks Contractions Contractions of the uterus can occur throughout pregnancy. Contractions are not always a sign that you are in labor.  WHAT ARE BRAXTON HICKS CONTRACTIONS?  Contractions that occur before labor are called Braxton Hicks contractions, or false labor. Toward the end of pregnancy (32-34 weeks), these contractions can develop more often and may become more forceful. This is not true labor because these contractions do not result in opening (dilatation) and thinning of the cervix. They are sometimes difficult to tell apart from true labor because these contractions can be forceful and people have different pain tolerances. You should not feel embarrassed if you go to the hospital with false labor. Sometimes, the only way to tell if you are in true labor is for your health care provider to look for changes in the cervix. If there are no prenatal problems or other health problems associated with the pregnancy, it is completely safe to be sent home with false labor and await the onset of true labor. HOW CAN YOU TELL THE DIFFERENCE BETWEEN TRUE AND FALSE LABOR? False Labor   The contractions of false labor are usually shorter and not as hard as those of true labor.   The contractions are usually irregular.   The contractions are often felt in the front of the lower abdomen and in the groin.   The contractions may go away when you walk around or change positions while lying down.   The contractions get weaker and are shorter lasting as time goes on.   The contractions do not usually become progressively stronger, regular, and closer together as with true labor.  True Labor   Contractions in true labor last 30-70  seconds, become very regular, usually become more intense, and increase in frequency.   The contractions do not go away with walking.   The discomfort is usually felt in the top of the uterus and spreads to the lower abdomen and low back.   True labor can be determined by your health care provider with an exam. This will show that the cervix is dilating and getting thinner.  WHAT TO REMEMBER  Keep up with your usual exercises and follow other instructions given by your health care provider.   Take medicines as directed by your health care provider.   Keep your regular prenatal appointments.   Eat and drink lightly if you think you are going into labor.   If Braxton Hicks contractions are making you uncomfortable:   Change your position from lying down or resting to walking, or from walking to resting.   Sit and rest in a tub of warm water.   Drink 2-3 glasses of water. Dehydration may cause these contractions.   Do slow and deep breathing several times an hour.  WHEN SHOULD I SEEK IMMEDIATE MEDICAL CARE? Seek immediate medical care if:  Your contractions become stronger, more regular, and closer together.   You have fluid leaking or gushing from your vagina.   You have a fever.   You pass blood-tinged mucus.   You have vaginal bleeding.   You have continuous abdominal pain.   You have low back pain that you never had before.   You feel your baby's head pushing  down and causing pelvic pressure.   Your baby is not moving as much as it used to.  This information is not intended to replace advice given to you by your health care provider. Make sure you discuss any questions you have with your health care provider. Document Released: 06/25/2005 Document Revised: 10/17/2015 Document Reviewed: 04/06/2013 Elsevier Interactive Patient Education  2017 [employer]

## 2016-06-07 NOTE — Telephone Encounter (Signed)
I resent a prescription with pyridoxine only. Hopefully they will have this available

## 2016-06-07 NOTE — Progress Notes (Signed)
Sabrina CorningDominique N Buckley is a [age] y.o. [id number] at 1627w2d for routine follow up.  She reports no vaginal bleeding, leakage of fluid, or contractions. + fetal movement   See flow sheet for details.  A/P: Pregnancy at 3727w2d.  Doing well.   Pregnancy issues include issues with gaining weight. Denies any chest pain or SOB at this visit.    Infant feeding choice: Breastfeeding  Contraception choice BTL  Infant circumcision desired not applicable  Flu and Tdapwas given previously  GBS/GC/CZ testing was not performed today.   Weight Gain: recommend weight gain for obese patient is between 5- 9 kg over course of pregnancy - patient has recently lost weight. Has been eating less because of URI. Also noted being nauseated and vomiting x 1 last week. She states she has struggled with nausea at times throughout pregnancy; this was similar to her last pregnancy. Interventions to help patient gain weight include recommendations to supplement with Boost especially in the mornings as patient often skips breakfast and afternoon as possible. Provided patient with B6/Ginger tablet to help with nausea.   Plans to stop smoking today   Preterm labor precautions reviewed. Safe sleep discussed. Kick counts reviewed. Follow up 2 weeks. Appt already scheduled [facility] and discussion regarding [facility]

## 2016-06-12 ENCOUNTER — Telehealth: Payer: Self-pay | Admitting: Family Medicine

## 2016-06-12 NOTE — Telephone Encounter (Signed)
Family Medicine After hours phone call  Patient's airway with complaints of pelvic pain. She states that she has been seen regularly in our clinic and she is [redacted] weeks pregnant. She denies any leakage of fluid or consistent contractions. She states that this pressure is new tonight and is wondering what to do.  I informed patient that without physical examination it is difficult to feel confident with any reassurance at this time and because of that I would strongly recommend getting evaluated at the MAU.  Patient thanked me and stated that [facility].  Kathee DeltonIan D Eleah Lahaie, MD,MS,  PGY3 06/12/2016 8:55 PM

## 2016-06-13 ENCOUNTER — Encounter (HOSPITAL_COMMUNITY): Payer: Self-pay | Admitting: *Deleted

## 2016-06-13 ENCOUNTER — Inpatient Hospital Stay (HOSPITAL_COMMUNITY)
Admission: AD | Admit: 2016-06-13 | Discharge: 2016-06-13 | Disposition: A | Discharge status: Home / Self Care | Payer: Medicaid Other | Source: Ambulatory Visit | Attending: Family Medicine | Admitting: Family Medicine

## 2016-06-13 DIAGNOSIS — O26893 Other specified pregnancy related conditions, third trimester: Secondary | ICD-10-CM | POA: Diagnosis not present

## 2016-06-13 DIAGNOSIS — O9A213 Injury, poisoning and certain other consequences of external causes complicating pregnancy, third trimester: Secondary | ICD-10-CM | POA: Diagnosis not present

## 2016-06-13 DIAGNOSIS — F17210 Nicotine dependence, cigarettes, uncomplicated: Secondary | ICD-10-CM | POA: Insufficient documentation

## 2016-06-13 DIAGNOSIS — R102 Pelvic and perineal pain: Secondary | ICD-10-CM | POA: Diagnosis present

## 2016-06-13 DIAGNOSIS — Z79899 Other long term (current) drug therapy: Secondary | ICD-10-CM | POA: Diagnosis not present

## 2016-06-13 DIAGNOSIS — Y9301 Activity, walking, marching and hiking: Secondary | ICD-10-CM | POA: Diagnosis not present

## 2016-06-13 DIAGNOSIS — Z3A33 33 weeks gestation of pregnancy: Secondary | ICD-10-CM

## 2016-06-13 DIAGNOSIS — Z9889 Other specified postprocedural states: Secondary | ICD-10-CM | POA: Insufficient documentation

## 2016-06-13 DIAGNOSIS — T1490XA Injury, unspecified, initial encounter: Secondary | ICD-10-CM

## 2016-06-13 DIAGNOSIS — O99333 Smoking (tobacco) complicating pregnancy, third trimester: Secondary | ICD-10-CM | POA: Diagnosis not present

## 2016-06-13 DIAGNOSIS — W010XXA Fall on same level from slipping, tripping and stumbling without subsequent striking against object, initial encounter: Secondary | ICD-10-CM | POA: Insufficient documentation

## 2016-06-13 HISTORY — DX: Ventral hernia without obstruction or gangrene: K43.9

## 2016-06-13 HISTORY — DX: Unspecified abnormal cytological findings in specimens from vagina: R87.629

## 2016-06-13 LAB — URINALYSIS, ROUTINE W REFLEX MICROSCOPIC
Bilirubin Urine: NEGATIVE
GLUCOSE, UA: NEGATIVE mg/dL
HGB URINE DIPSTICK: NEGATIVE
KETONES UR: NEGATIVE mg/dL
LEUKOCYTES UA: NEGATIVE
Nitrite: NEGATIVE
PROTEIN: NEGATIVE mg/dL
Specific Gravity, Urine: 1.023 (ref 1.005–1.030)
pH: 6 (ref 5.0–8.0)

## 2016-06-13 LAB — WET PREP, GENITAL
Clue Cells Wet Prep HPF POC: NONE SEEN
Sperm: NONE SEEN
Trich, Wet Prep: NONE SEEN
Yeast Wet Prep HPF POC: NONE SEEN

## 2016-06-13 MED ORDER — ACETAMINOPHEN 325 MG PO TABS
650.0000 mg | ORAL_TABLET | Freq: Four times a day (QID) | ORAL | Status: DC | PRN
Start: 2016-06-13 — End: 2016-06-13
  Administered 2016-06-13: 650 mg via ORAL
  Filled 2016-06-13: qty 2

## 2016-06-13 MED ORDER — ACETAMINOPHEN 325 MG PO TABS: 650.0000 mg | ORAL_TABLET | Freq: Four times a day (QID) | ORAL | 0 refills | Status: DC | PRN

## 2016-06-13 NOTE — MAU Note (Signed)
Urine in the lab  

## 2016-06-13 NOTE — Discharge Instructions (Signed)
What Do I Need to Know About Injuries During Pregnancy? °Injuries can happen during pregnancy. Minor falls and accidents usually do not harm you or your baby. However, any injury should be reported to your doctor. °What can I do to protect myself from injuries? °· Remove rugs and loose objects on the floor. °· Wear comfortable shoes that have a good grip. Do not wear high-heeled shoes. °· Always wear your seat belt. The lap belt should be below your belly. Always practice safe driving. °· Do not ride on a motorcycle. °· Do not participate in high-impact activities or sports. °· Avoid: °¨ Walking on wet or slippery floors. °¨ Fires. °¨ Starting fires. °¨ Lifting heavy pots of boiling or hot liquids. °¨ Fixing electrical problems. °· Only take medicine as told by your doctor. °· Know your blood type and the blood type of the baby's father. °· Call your local emergency services (911 in the [place] if you are a victim of domestic violence or assault. For help and support, contact the National Domestic Violence Hotline. °Get help right away if: °· You fall on your belly or have any high-impact accident or injury. °· You have been a victim of domestic violence or any kind of violence. °· You have been in a car accident. °· You have bleeding from your vagina. °· Fluid is leaking from your vagina. °· You start to have belly cramping (contractions) or pain. °· You feel weak or pass out (faint). °· You start to throw up (vomit) after an injury. °· You have been burned. °· You have a stiff neck or neck pain. °· You get a headache or have vision problems after an injury. °· You do not feel the baby move or the baby is not moving as much as normal. °This information is not intended to replace advice given to you by your health care provider. Make sure you discuss any questions you have with your health care provider. °Document Released: 07/28/2010 Document Revised: 12/01/2015 Document Reviewed: 04/01/2013 °Elsevier Interactive  Patient Education © 2017 [employer] ° °

## 2016-06-13 NOTE — MAU Note (Signed)
States she fell around 6PM yesterday. States she slipped and fell onto her bottom. States she felt like something "popped", pointing to low abd/suprapubic area. States the pain is constant and worse than last night. Denies bleeding or change in vaginal discharge. States she went to work,but had to leave because of the pain.

## 2016-06-13 NOTE — MAU Provider Note (Signed)
History     CSN: [id number]  Arrival date and time: 06/13/16 1130   First Provider Initiated Contact with Patient 06/13/16 1233      Chief Complaint  Patient presents with  . Fall   Slipped while wearing high heel shoes on hardwood floors around 6pm last night. She felt like something "popped" in her pelvis, and continued to fall and land on her buttocks. No blunt abdominal trauma. No vaginal bleeding, gush of fluid. Patient states nothing more than the discharge she has been having all pregnancy. No abdominal cramping, good/normal fetal movement.    Fall  The accident occurred 12 to 24 hours ago. The fall occurred while walking. She fell from a height of 3 to 5 ft. She landed on hard floor. The point of impact was the buttocks. Pain location: bilateral pelvic pain located at the crease of the legs/mons pubis. The pain is at a severity of 8/10. The pain is mild. The symptoms are aggravated by ambulation and pressure on injury. Pertinent negatives include no abdominal pain, bowel incontinence, hematuria, loss of consciousness, nausea, numbness, tingling or vomiting. She has tried nothing for the symptoms.    OB History    Gravida Para Term Preterm AB Living   4 2 1 1 1 2    SAB TAB Ectopic Multiple Live Births   1       2      Past Medical History:  Diagnosis Date  . Chlamydia   . Vaginal Pap smear, abnormal   . Ventral hernia     Past Surgical History:  Procedure Laterality Date  . CESAREAN SECTION    . HERNIA REPAIR    . UMBILICAL HERNIA REPAIR N/A 2013    History reviewed. No pertinent family history.  Social History  Substance Use Topics  . Smoking status: Current Every Day Smoker    Packs/day: 0.25    Types: Cigarettes  . Smokeless tobacco: Never Used  . Alcohol use No    Allergies: No Known Allergies  Prescriptions Prior to Admission  Medication Sig Dispense Refill Last Dose  . ferrous sulfate 325 (65 FE) MG tablet Take 1 tablet (325 mg total) by mouth 2  (two) times daily with a meal. 60 tablet 3   . Prenatal Vit-Fe Fumarate-FA (MULTIVITAMIN-PRENATAL) 27-0.8 MG TABS tablet Take 1 tablet by mouth daily at 12 noon. 30 each 0   . pyridOXINE (VITAMIN B-6) 25 MG tablet Take 1 tablet (25 mg total) by mouth every 8 (eight) hours as needed (nausea). 60 tablet 0     Review of Systems  Gastrointestinal: Negative for abdominal pain, bowel incontinence, nausea and vomiting.  Genitourinary: Positive for urgency. Negative for dysuria, frequency and hematuria.       Discharge is yellow, watery, nonodorous  Musculoskeletal: Positive for back pain.  Neurological: Negative for tingling, loss of consciousness, weakness and numbness.   Physical Exam   Blood pressure 121/76, pulse 91, temperature 97.6 F (36.4 C), temperature source Oral, resp. rate 16, height 5\' 3"  (1.6 m), weight 85.7 kg (189 lb), last menstrual period 10/25/2015.  Physical Exam  MAU Course  Procedures  MDM Care continued under Dr. Omer JackMumaw  Assessment and Plan  Care continued under Dr. Sherolyn BubaMumaw  [facility] 06/13/2016, 12:33 PM     OB FELLOW MEDICAL PA STUDENT NOTE ATTESTATION  I have seen and examined this patient. Note this is a Psychologist, occupationalmedical student note and as such does not necessarily reflect the patient's plan of care. Please see  MAU note for this date of service.    Jen MowElizabeth Vanessa Alesi, DO OB Fellow 06/18/2016, 3:55 PM

## 2016-06-14 LAB — GC/CHLAMYDIA PROBE AMP (~~LOC~~) NOT AT ARMC
CHLAMYDIA, DNA PROBE: NEGATIVE
NEISSERIA GONORRHEA: NEGATIVE

## 2016-06-18 ENCOUNTER — Ambulatory Visit (INDEPENDENT_AMBULATORY_CARE_PROVIDER_SITE_OTHER): Payer: Medicaid Other | Admitting: Family Medicine

## 2016-06-18 ENCOUNTER — Encounter: Payer: Self-pay | Admitting: Family Medicine

## 2016-06-18 DIAGNOSIS — Z3483 Encounter for supervision of other normal pregnancy, third trimester: Secondary | ICD-10-CM

## 2016-06-18 DIAGNOSIS — O34219 Maternal care for unspecified type scar from previous cesarean delivery: Secondary | ICD-10-CM

## 2016-06-18 NOTE — Progress Notes (Signed)
PRENATAL VISIT NOTE  Subjective:  Sabrina Buckley is a 30 y.o. [phone] at 4928w6d being seen today for ongoing prenatal care.  She is currently monitored for the following issues for this low-risk pregnancy and has OBESITY, UNSPECIFIED; TOBACCO DEPENDENCE; GERD; VENTRAL HERNIA; Encounter for supervision of other normal pregnancy; and Previous cesarean delivery, antepartum condition or complication on her problem list.  Patient reports no complaints.  Contractions: Not present.  .  Movement: Present. Denies leaking of fluid.   The following portions of the patient's history were reviewed and updated as appropriate: allergies, current medications, past family history, past medical history, past social history, past surgical history and problem list. Problem list updated.  Objective:   Vitals:   06/18/16 1348  BP: (!) 118/58  Pulse: 79  Temp: 97.9 F (36.6 C)  Weight: 192 lb (87.1 kg)    Fetal Status: Fetal Heart Rate (bpm): 137 Fundal Height: 33 cm Movement: Present     General:  Alert, oriented and cooperative. Patient is in no acute distress.  Skin: Skin is warm and dry. No rash noted.   Cardiovascular: Normal heart rate noted  Respiratory: Normal respiratory effort, no problems with respiration noted  Abdomen: Soft, gravid, appropriate for gestational age. Pain/Pressure: Present     Pelvic:  Cervical exam deferred        Extremities: Normal range of motion.     Mental Status: Normal mood and affect. Normal behavior. Normal judgment and thought content.   Assessment and Plan:  Pregnancy: [id number] at 6428w6d  1. Previous cesarean delivery, antepartum condition or complication [facility] Attending Consult Note  30 y.o. [phone] at 428w6d with Estimated Date of Delivery: 07/31/16 was seen today in office to discuss trial of labor after cesarean section (TOLAC) versus elective repeat cesarean delivery (ERCD). The following risks were discussed with the patient.  Risk  of uterine rupture at term is 0.78 percent with TOLAC and 0.22 percent with ERCD. 1 in 10 uterine ruptures will result in neonatal death or neurological injury. The benefits of a trial of labor after cesarean (TOLAC) resulting in a vaginal birth after cesarean (VBAC) include the following: shorter length of hospital stay and postpartum recovery (in most cases); fewer complications, such as postpartum fever, wound or uterine infection, thromboembolism (blood clots in the leg or lung), need for blood transfusion and fewer neonatal breathing problems. The risks of an attempted VBAC or TOLAC include the following: Risk of failed trial of labor after cesarean (TOLAC) without a vaginal birth after cesarean (VBAC) resulting in repeat cesarean delivery (RCD) in about 20 to 40 percent of women who attempt VBAC.  Risk of rupture of uterus resulting in an emergency cesarean delivery. The risk of uterine rupture may be related in part to the type of uterine incision made during the first cesarean delivery. A previous transverse uterine incision has the lowest risk of rupture (0.2 to 1.5 percent risk). Vertical or T-shaped uterine incisions have a higher risk of uterine rupture (4 to 9 percent risk)The risk of fetal death is very low with both VBAC and elective repeat cesarean delivery (ERCD), but the likelihood of fetal death is higher with VBAC than with ERCD. Maternal death is very rare with either type of delivery. The risks of an elective repeat cesarean delivery (ERCD) were reviewed with the patient including but not limited to: 08/998 risk of uterine rupture which could have serious consequences, bleeding which may require transfusion; infection which may require antibiotics; injury to  bowel, bladder or other surrounding organs (bowel, bladder, ureters); injury to the fetus; need for additional procedures including hysterectomy in the event of a life-threatening hemorrhage; thromboembolic phenomenon; abnormal  placentation; incisional problems; death and other postoperative or anesthesia complications.    These risks and benefits are summarized on the consent form, which was reviewed with the patient during the visit.  All her questions answered and she signed a consent indicating a preference for TOLAC/ERCD. A copy of the consent was given to the patient.  She does say that she would want tubal sterilization in the event a cesarean section is performed due to any maternal-fetal indication. Discussed the additional risks of regret, failure rate of 0.5-1%  with increased risk of ectopic gestation and permanent and irreversible nature of the procedure.  Other forms of reversible BCM discussed, also discussed vasectomy, patient desires BTS. Papers signed today.  [facility]   2. Encounter for supervision of other normal pregnancy in third trimester Continue routine prenatal care.   Preterm labor symptoms and general obstetric precautions including but not limited to vaginal bleeding, contractions, leaking of fluid and fetal movement were reviewed in detail with the patient. Please refer to After Visit Summary for other counseling recommendations.  Return in 2 weeks (on 07/02/2016).   Reva Boresanya S Akire Rennert, MD

## 2016-06-18 NOTE — MAU Provider Note (Signed)
Chief Complaint:  Fall   First Provider Initiated Contact with Patient 06/13/16 1233      HPI: Sabrina Buckley is a 30 y.o. [id number] at 5731w6d who presents to maternity admissions reporting falling while pregnant  Patient states last night she was at a work Christmas party when she slipped on the floor while wearing high heels. She felt a "pop" in her pelvis but able to walk right afterward. She landed on her bottom, avoiding any trauma to the abdomen. She denies any contractions, LOF or vaginal bleeding after incident. She has had good fetal movement since the incident.  Denies contractions, leakage of fluid or vaginal bleeding today.  Good fetal movement today.     Past Medical History: Past Medical History:  Diagnosis Date  . Chlamydia   . Vaginal Pap smear, abnormal   . Ventral hernia     Past obstetric history: OB History  Gravida Para Term Preterm AB Living  4 2 1 1 1 2   SAB TAB Ectopic Multiple Live Births  1       2    # Outcome Date GA Lbr Len/2nd Weight Sex Delivery Anes PTL Lv  4 Current           3 SAB 10/08/11 5283w0d    SAB  N      Birth Comments: SAB at 10-11 weeks. Did not require instrumentation or medication, was fully spontaneous.  2 Preterm 07/24/06 7084w0d  5 lb 10 oz (2.551 kg) F CS-Unspec  N LIV     Birth Comments: [facility] at 35 weeks [facility] due to "arrythmia" of baby's heartbeat. Child discharged from nursery on propranolol and has grown out of cardiac issue, follows up regularly with cardiologist each year.  1 Term 04/22/02   6 lb 14 oz (3.118 kg) M Vag-Spont  Y LIV     Complications: Cervical abnormality complicating pregnancy in third trimester     Birth Comments: Required bed rest from 6 mos to delivery due to premature dilation. Delivered full term, no other issues during that pregnancy.      Past Surgical History: Past Surgical History:  Procedure Laterality Date  . CESAREAN SECTION    . HERNIA REPAIR    . UMBILICAL  HERNIA REPAIR N/A 2013     Family History: History reviewed. No pertinent family history.  Social History: Social History  Substance Use Topics  . Smoking status: Current Every Day Smoker    Packs/day: 0.25    Types: Cigarettes  . Smokeless tobacco: Never Used  . Alcohol use No    Allergies: No Known Allergies  Meds:  No prescriptions prior to admission.    I have reviewed patient's Past Medical Hx, Surgical Hx, Family Hx, Social Hx, medications and allergies.   ROS:  A comprehensive ROS was negative except per HPI.    Physical Exam   Vitals:   06/13/16 1154 06/13/16 1155 06/13/16 1419 06/13/16 1451  BP: 121/76 121/76 118/76 120/77  Pulse: 91 91 81 95  Resp: 16   16  Temp: 97.6 F (36.4 C)     TempSrc: Oral     Weight:      Height:        Constitutional: Well-developed, well-nourished female in no acute distress.  Cardiovascular: normal rate Respiratory: normal effort GI: Abd soft, non-tender, gravid appropriate for gestational age. Pos BS x 4 MS: Extremities nontender, no edema, normal ROM Neurologic: Alert and oriented x 4.  GU: Neg CVAT.  Pelvic: NEFG, physiologic discharge, no blood, cervix clean. No CMT. SVE: Dilation: Closed Effacement (%): Thick Exam by:: Dr. Omer JackMumaw    FHT:  Baseline 125, moderate variability, accelerations present, no decelerations Contractions: None     MAU Course: NST - REACTIVE SVE- closed, thick, posterior, high  MDM: Plan of care reviewed with patient, including labs and tests ordered and medical treatment. Due to the mechanism of injury with no abdominal trauma and presentation after trauma >4 hours after incident, Ok for discharge home with precautions explained regarding bleeding and fetal movement.   I personally reviewed the patient's NST today, found to be REACTIVE. 125 bpm, mod var, +accels, no decels. CTX: None.   Assessment: 1. Traumatic injury during pregnancy in third trimester     Plan: Discharge home  in stable condition.  Preterm Labor precautions and fetal kick counts Bleeding precautions given  Follow-up Information    [facility]. Schedule an appointment as soon as possible for a visit in 1 week(s).   Why:  Follow up Ob appointment Contact information: [address] [place] [id number] [phone]            Medication List    TAKE these medications   acetaminophen 325 MG tablet Commonly known as:  TYLENOL Take 2 tablets (650 mg total) by mouth every 6 (six) hours as needed for moderate pain (pain).   ferrous sulfate 325 (65 FE) MG tablet Take 1 tablet (325 mg total) by mouth 2 (two) times daily with a meal.   multivitamin-prenatal 27-0.8 MG Tabs tablet Take 1 tablet by mouth daily at 12 noon.   pyridOXINE 25 MG tablet Commonly known as:  VITAMIN B-6 Take 1 tablet (25 mg total) by mouth every 8 (eight) hours as needed (nausea).       Jen MowElizabeth Camaron Cammack, DO OB Fellow [facility] 06/18/2016 3:47 PM

## 2016-06-18 NOTE — Patient Instructions (Signed)
Third Trimester of Pregnancy The third trimester is from week 29 through week 40 (months 7 through 9). The third trimester is a time when the unborn baby (fetus) is growing rapidly. At the end of the ninth month, the fetus is about 20 inches in length and weighs 6-10 pounds. Body changes during your third trimester Your body goes through many changes during pregnancy. The changes vary from woman to woman. During the third trimester:  Your weight will continue to increase. You can expect to gain 25-35 pounds (11-16 kg) by the end of the pregnancy.  You may begin to get stretch marks on your hips, abdomen, and breasts.  You may urinate more often because the fetus is moving lower into your pelvis and pressing on your bladder.  You may develop or continue to have heartburn. This is caused by increased hormones that slow down muscles in the digestive tract.  You may develop or continue to have constipation because increased hormones slow digestion and cause the muscles that push waste through your intestines to relax.  You may develop hemorrhoids. These are swollen veins (varicose veins) in the rectum that can itch or be painful.  You may develop swollen, bulging veins (varicose veins) in your legs.  You may have increased body aches in the pelvis, back, or thighs. This is due to weight gain and increased hormones that are relaxing your joints.  You may have changes in your hair. These can include thickening of your hair, rapid growth, and changes in texture. Some women also have hair loss during or after pregnancy, or hair that feels dry or thin. Your hair will most likely return to normal after your baby is born.  Your breasts will continue to grow and they will continue to become tender. A yellow fluid (colostrum) may leak from your breasts. This is the first milk you are producing for your baby.  Your belly button may stick out.  You may notice more swelling in your hands, face, or  ankles.  You may have increased tingling or numbness in your hands, arms, and legs. The skin on your belly may also feel numb.  You may feel short of breath because of your expanding uterus.  You may have more problems sleeping. This can be caused by the size of your belly, increased need to urinate, and an increase in your body's metabolism.  You may notice the fetus "dropping," or moving lower in your abdomen.  You may have increased vaginal discharge.  Your cervix becomes thin and soft (effaced) near your due date. What to expect at prenatal visits You will have prenatal exams every 2 weeks until week 36. Then you will have weekly prenatal exams. During a routine prenatal visit:  You will be weighed to make sure you and the fetus are growing normally.  Your blood pressure will be taken.  Your abdomen will be measured to track your baby's growth.  The fetal heartbeat will be listened to.  Any test results from the previous visit will be discussed.  You may have a cervical check near your due date to see if you have effaced. At around 36 weeks, your health care provider will check your cervix. At the same time, your health care provider will also perform a test on the secretions of the vaginal tissue. This test is to determine if a type of bacteria, Group B streptococcus, is present. Your health care provider will explain this further. Your health care provider may ask you:    What your birth plan is.  How you are feeling.  If you are feeling the baby move.  If you have had any abnormal symptoms, such as leaking fluid, bleeding, severe headaches, or abdominal cramping.  If you are using any tobacco products, including cigarettes, chewing tobacco, and electronic cigarettes.  If you have any questions. Other tests or screenings that may be performed during your third trimester include:  Blood tests that check for low iron levels (anemia).  Fetal testing to check the health,  activity level, and growth of the fetus. Testing is done if you have certain medical conditions or if there are problems during the pregnancy.  Nonstress test (NST). This test checks the health of your baby to make sure there are no signs of problems, such as the baby not getting enough oxygen. During this test, a belt is placed around your belly. The baby is made to move, and its heart rate is monitored during movement. What is false labor? False labor is a condition in which you feel small, irregular tightenings of the muscles in the womb (contractions) that eventually go away. These are called Braxton Hicks contractions. Contractions may last for hours, days, or even weeks before true labor sets in. If contractions come at regular intervals, become more frequent, increase in intensity, or become painful, you should see your health care provider. What are the signs of labor?  Abdominal cramps.  Regular contractions that start at 10 minutes apart and become stronger and more frequent with time.  Contractions that start on the top of the uterus and spread down to the lower abdomen and back.  Increased pelvic pressure and dull back pain.  A watery or bloody mucus discharge that comes from the vagina.  Leaking of amniotic fluid. This is also known as your "water breaking." It could be a slow trickle or a gush. Let your doctor know if it has a color or strange odor. If you have any of these signs, call your health care provider right away, even if it is before your due date. Follow these instructions at home: Eating and drinking  Continue to eat regular, healthy meals.  Do not eat:  Raw meat or meat spreads.  Unpasteurized milk or cheese.  Unpasteurized juice.  Store-made salad.  Refrigerated smoked seafood.  Hot dogs or deli meat, unless they are piping hot.  More than 6 ounces of albacore tuna a week.  Shark, swordfish, king mackerel, or tile fish.  Store-made salads.  Raw  sprouts, such as mung bean or alfalfa sprouts.  Take prenatal vitamins as told by your health care provider.  Take 1000 mg of calcium daily as told by your health care provider.  If you develop constipation:  Take over-the-counter or prescription medicines.  Drink enough fluid to keep your urine clear or pale yellow.  Eat foods that are high in fiber, such as fresh fruits and vegetables, whole grains, and beans.  Limit foods that are high in fat and processed sugars, such as fried and sweet foods. Activity  Exercise only as directed by your health care provider. Healthy pregnant women should aim for 2 hours and 30 minutes of moderate exercise per week. If you experience any pain or discomfort while exercising, stop.  Avoid heavy lifting.  Do not exercise in extreme heat or humidity, or at high altitudes.  Wear low-heel, comfortable shoes.  Practice good posture.  Do not travel far distances unless it is absolutely necessary and only with the approval   of your health care provider.  Wear your seat belt at all times while in a car, on a bus, or on a plane.  Take frequent breaks and rest with your legs elevated if you have leg cramps or low back pain.  Do not use hot tubs, steam rooms, or saunas.  You may continue to have sex unless your health care provider tells you otherwise. Lifestyle  Do not use any products that contain nicotine or tobacco, such as cigarettes and e-cigarettes. If you need help quitting, ask your health care provider.  Do not drink alcohol.  Do not use any medicinal herbs or unprescribed drugs. These chemicals affect the formation and growth of the baby.  If you develop varicose veins:  Wear support pantyhose or compression stockings as told by your healthcare provider.  Elevate your feet for 15 minutes, 3-4 times a day.  Wear a supportive maternity bra to help with breast tenderness. General instructions  Take over-the-counter and prescription  medicines only as told by your health care provider. There are medicines that are either safe or unsafe to take during pregnancy.  Take warm sitz baths to soothe any pain or discomfort caused by hemorrhoids. Use hemorrhoid cream or witch hazel if your health care provider approves.  Avoid cat litter boxes and soil used by cats. These carry germs that can cause birth defects in the baby. If you have a cat, ask someone to clean the litter box for you.  To prepare for the arrival of your baby:  Take prenatal classes to understand, practice, and ask questions about the labor and delivery.  Make a trial run to the hospital.  Visit the hospital and tour the maternity area.  Arrange for maternity or paternity leave through employers.  Arrange for family and friends to take care of pets while you are in the hospital.  Purchase a rear-facing car seat and make sure you know how to install it in your car.  Pack your hospital bag.  Prepare the baby's nursery. Make sure to remove all pillows and stuffed animals from the baby's crib to prevent suffocation.  Visit your dentist if you have not gone during your pregnancy. Use a soft toothbrush to brush your teeth and be gentle when you floss.  Keep all prenatal follow-up visits as told by your health care provider. This is important. Contact a health care provider if:  You are unsure if you are in labor or if your water has broken.  You become dizzy.  You have mild pelvic cramps, pelvic pressure, or nagging pain in your abdominal area.  You have lower back pain.  You have persistent nausea, vomiting, or diarrhea.  You have an unusual or bad smelling vaginal discharge.  You have pain when you urinate. Get help right away if:  You have a fever.  You are leaking fluid from your vagina.  You have spotting or bleeding from your vagina.  You have severe abdominal pain or cramping.  You have rapid weight loss or weight gain.  You have  shortness of breath with chest pain.  You notice sudden or extreme swelling of your face, hands, ankles, feet, or legs.  Your baby makes fewer than 10 movements in 2 hours.  You have severe headaches that do not go away with medicine.  You have vision changes. Summary  The third trimester is from week 29 through week 40, months 7 through 9. The third trimester is a time when the unborn baby (fetus)   is growing rapidly.  During the third trimester, your discomfort may increase as you and your baby continue to gain weight. You may have abdominal, leg, and back pain, sleeping problems, and an increased need to urinate.  During the third trimester your breasts will keep growing and they will continue to become tender. A yellow fluid (colostrum) may leak from your breasts. This is the first milk you are producing for your baby.  False labor is a condition in which you feel small, irregular tightenings of the muscles in the womb (contractions) that eventually go away. These are called Braxton Hicks contractions. Contractions may last for hours, days, or even weeks before true labor sets in.  Signs of labor can include: abdominal cramps; regular contractions that start at 10 minutes apart and become stronger and more frequent with time; watery or bloody mucus discharge that comes from the vagina; increased pelvic pressure and dull back pain; and leaking of amniotic fluid. This information is not intended to replace advice given to you by your health care provider. Make sure you discuss any questions you have with your health care provider. Document Released: 06/19/2001 Document Revised: 12/01/2015 Document Reviewed: 08/26/2012 [employer] Interactive Patient Education  2017 [employer]   Breastfeeding Deciding to breastfeed is one of the best choices you can make for you and your baby. A change in hormones during pregnancy causes your breast tissue to grow and increases the number and size of your  milk ducts. These hormones also allow proteins, sugars, and fats from your blood supply to make breast milk in your milk-producing glands. Hormones prevent breast milk from being released before your baby is born as well as prompt milk flow after birth. Once breastfeeding has begun, thoughts of your baby, as well as his or her sucking or crying, can stimulate the release of milk from your milk-producing glands. Benefits of breastfeeding For Your Baby  Your first milk (colostrum) helps your baby's digestive system function better.  There are antibodies in your milk that help your baby fight off infections.  Your baby has a lower incidence of asthma, allergies, and sudden infant death syndrome.  The nutrients in breast milk are better for your baby than infant formulas and are designed uniquely for your baby's needs.  Breast milk improves your baby's brain development.  Your baby is less likely to develop other conditions, such as childhood obesity, asthma, or type 2 diabetes mellitus. For You  Breastfeeding helps to create a very special bond between you and your baby.  Breastfeeding is convenient. Breast milk is always available at the correct temperature and costs nothing.  Breastfeeding helps to burn calories and helps you lose the weight gained during pregnancy.  Breastfeeding makes your uterus contract to its prepregnancy size faster and slows bleeding (lochia) after you give birth.  Breastfeeding helps to lower your risk of developing type 2 diabetes mellitus, osteoporosis, and breast or ovarian cancer later in life. Signs that your baby is hungry Early Signs of Hunger  Increased alertness or activity.  Stretching.  Movement of the head from side to side.  Movement of the head and opening of the mouth when the corner of the mouth or cheek is stroked (rooting).  Increased sucking sounds, smacking lips, cooing, sighing, or squeaking.  Hand-to-mouth movements.  Increased  sucking of fingers or hands. Late Signs of Hunger  Fussing.  Intermittent crying. Extreme Signs of Hunger  Signs of extreme hunger will require calming and consoling before your baby will   be able to breastfeed successfully. Do not wait for the following signs of extreme hunger to occur before you initiate breastfeeding:  Restlessness.  A loud, strong cry.  Screaming. Breastfeeding basics  Breastfeeding Initiation  Find a comfortable place to sit or lie down, with your neck and back well supported.  Place a pillow or rolled up blanket under your baby to bring him or her to the level of your breast (if you are seated). Nursing pillows are specially designed to help support your arms and your baby while you breastfeed.  Make sure that your baby's abdomen is facing your abdomen.  Gently massage your breast. With your fingertips, massage from your chest wall toward your nipple in a circular motion. This encourages milk flow. You may need to continue this action during the feeding if your milk flows slowly.  Support your breast with 4 fingers underneath and your thumb above your nipple. Make sure your fingers are well away from your nipple and your baby's mouth.  Stroke your baby's lips gently with your finger or nipple.  When your baby's mouth is open wide enough, quickly bring your baby to your breast, placing your entire nipple and as much of the colored area around your nipple (areola) as possible into your baby's mouth.  More areola should be visible above your baby's upper lip than below the lower lip.  Your baby's tongue should be between his or her lower gum and your breast.  Ensure that your baby's mouth is correctly positioned around your nipple (latched). Your baby's lips should create a seal on your breast and be turned out (everted).  It is common for your baby to suck about 2-3 minutes in order to start the flow of breast milk. Latching  Teaching your baby how to latch  on to your breast properly is very important. An improper latch can cause nipple pain and decreased milk supply for you and poor weight gain in your baby. Also, if your baby is not latched onto your nipple properly, he or she may swallow some air during feeding. This can make your baby fussy. Burping your baby when you switch breasts during the feeding can help to get rid of the air. However, teaching your baby to latch on properly is still the best way to prevent fussiness from swallowing air while breastfeeding. Signs that your baby has successfully latched on to your nipple:  Silent tugging or silent sucking, without causing you pain.  Swallowing heard between every 3-4 sucks.  Muscle movement above and in front of his or her ears while sucking. Signs that your baby has not successfully latched on to nipple:  Sucking sounds or smacking sounds from your baby while breastfeeding.  Nipple pain. If you think your baby has not latched on correctly, slip your finger into the corner of your baby's mouth to break the suction and place it between your baby's gums. Attempt breastfeeding initiation again. Signs of Successful Breastfeeding  Signs from your baby:  A gradual decrease in the number of sucks or complete cessation of sucking.  Falling asleep.  Relaxation of his or her body.  Retention of a small amount of milk in his or her mouth.  Letting go of your breast by himself or herself. Signs from you:  Breasts that have increased in firmness, weight, and size 1-3 hours after feeding.  Breasts that are softer immediately after breastfeeding.  Increased milk volume, as well as a change in milk consistency and color by   the fifth day of breastfeeding.  Nipples that are not sore, cracked, or bleeding. Signs That Your Baby is Getting Enough Milk  Wetting at least 1-2 diapers during the first 24 hours after birth.  Wetting at least 5-6 diapers every 24 hours for the first week after  birth. The urine should be clear or pale yellow by 5 days after birth.  Wetting 6-8 diapers every 24 hours as your baby continues to grow and develop.  At least 3 stools in a 24-hour period by age 5 days. The stool should be soft and yellow.  At least 3 stools in a 24-hour period by age 7 days. The stool should be seedy and yellow.  No loss of weight greater than 10% of birth weight during the first 3 days of age.  Average weight gain of 4-7 ounces (113-198 g) per week after age 4 days.  Consistent daily weight gain by age 5 days, without weight loss after the age of 2 weeks. After a feeding, your baby may spit up a small amount. This is common. Breastfeeding frequency and duration Frequent feeding will help you make more milk and can prevent sore nipples and breast engorgement. Breastfeed when you feel the need to reduce the fullness of your breasts or when your baby shows signs of hunger. This is called "breastfeeding on demand." Avoid introducing a pacifier to your baby while you are working to establish breastfeeding (the first 4-6 weeks after your baby is born). After this time you may choose to use a pacifier. Research has shown that pacifier use during the first year of a baby's life decreases the risk of sudden infant death syndrome (SIDS). Allow your baby to feed on each breast as long as he or she wants. Breastfeed until your baby is finished feeding. When your baby unlatches or falls asleep while feeding from the first breast, offer the second breast. Because newborns are often sleepy in the first few weeks of life, you may need to awaken your baby to get him or her to feed. Breastfeeding times will vary from baby to baby. However, the following rules can serve as a guide to help you ensure that your baby is properly fed:  Newborns (babies 4 weeks of age or younger) may breastfeed every 1-3 hours.  Newborns should not go longer than 3 hours during the day or 5 hours during the night  without breastfeeding.  You should breastfeed your baby a minimum of 8 times in a 24-hour period until you begin to introduce solid foods to your baby at around 6 months of age. Breast milk pumping Pumping and storing breast milk allows you to ensure that your baby is exclusively fed your breast milk, even at times when you are unable to breastfeed. This is especially important if you are going back to work while you are still breastfeeding or when you are not able to be present during feedings. Your lactation consultant can give you guidelines on how long it is safe to store breast milk. A breast pump is a machine that allows you to pump milk from your breast into a sterile bottle. The pumped breast milk can then be stored in a refrigerator or freezer. Some breast pumps are operated by hand, while others use electricity. Ask your lactation consultant which type will work best for you. Breast pumps can be purchased, but some hospitals and breastfeeding support groups lease breast pumps on a monthly basis. A lactation consultant can teach you how to   hand express breast milk, if you prefer not to use a pump. Caring for your breasts while you breastfeed Nipples can become dry, cracked, and sore while breastfeeding. The following recommendations can help keep your breasts moisturized and healthy:  Avoid using soap on your nipples.  Wear a supportive bra. Although not required, special nursing bras and tank tops are designed to allow access to your breasts for breastfeeding without taking off your entire bra or top. Avoid wearing underwire-style bras or extremely tight bras.  Air dry your nipples for 3-4minutes after each feeding.  Use only cotton bra pads to absorb leaked breast milk. Leaking of breast milk between feedings is normal.  Use lanolin on your nipples after breastfeeding. Lanolin helps to maintain your skin's normal moisture barrier. If you use pure lanolin, you do not need to wash it off  before feeding your baby again. Pure lanolin is not toxic to your baby. You may also hand express a few drops of breast milk and gently massage that milk into your nipples and allow the milk to air dry. In the first few weeks after giving birth, some women experience extremely full breasts (engorgement). Engorgement can make your breasts feel heavy, warm, and tender to the touch. Engorgement peaks within 3-5 days after you give birth. The following recommendations can help ease engorgement:  Completely empty your breasts while breastfeeding or pumping. You may want to start by applying warm, moist heat (in the shower or with warm water-soaked hand towels) just before feeding or pumping. This increases circulation and helps the milk flow. If your baby does not completely empty your breasts while breastfeeding, pump any extra milk after he or she is finished.  Wear a snug bra (nursing or regular) or tank top for 1-2 days to signal your body to slightly decrease milk production.  Apply ice packs to your breasts, unless this is too uncomfortable for you.  Make sure that your baby is latched on and positioned properly while breastfeeding. If engorgement persists after 48 hours of following these recommendations, contact your health care provider or a lactation consultant. Overall health care recommendations while breastfeeding  Eat healthy foods. Alternate between meals and snacks, eating 3 of each per day. Because what you eat affects your breast milk, some of the foods may make your baby more irritable than usual. Avoid eating these foods if you are sure that they are negatively affecting your baby.  Drink milk, fruit juice, and water to satisfy your thirst (about 10 glasses a day).  Rest often, relax, and continue to take your prenatal vitamins to prevent fatigue, stress, and anemia.  Continue breast self-awareness checks.  Avoid chewing and smoking tobacco. Chemicals from cigarettes that pass  into breast milk and exposure to secondhand smoke may harm your baby.  Avoid alcohol and drug use, including marijuana. Some medicines that may be harmful to your baby can pass through breast milk. It is important to ask your health care provider before taking any medicine, including all over-the-counter and prescription medicine as well as vitamin and herbal supplements. It is possible to become pregnant while breastfeeding. If birth control is desired, ask your health care provider about options that will be safe for your baby. Contact a health care provider if:  You feel like you want to stop breastfeeding or have become frustrated with breastfeeding.  You have painful breasts or nipples.  Your nipples are cracked or bleeding.  Your breasts are red, tender, or warm.  You have   a swollen area on either breast.  You have a fever or chills.  You have nausea or vomiting.  You have drainage other than breast milk from your nipples.  Your breasts do not become full before feedings by the fifth day after you give birth.  You feel sad and depressed.  Your baby is too sleepy to eat well.  Your baby is having trouble sleeping.  Your baby is wetting less than 3 diapers in a 24-hour period.  Your baby has less than 3 stools in a 24-hour period.  Your baby's skin or the white part of his or her eyes becomes yellow.  Your baby is not gaining weight by 615 days of age. Get help right away if:  Your baby is overly tired (lethargic) and does not want to wake up and feed.  Your baby develops an unexplained fever. This information is not intended to replace advice given to you by your health care provider. Make sure you discuss any questions you have with your health care provider. Document Released: 06/25/2005 Document Revised: 12/07/2015 Document Reviewed: 12/17/2012 Elsevier Interactive Patient Education  2017 Elsevier Inc.  Vaginal Birth After Cesarean Delivery Vaginal birth after  cesarean delivery (VBAC) is giving birth vaginally after previously delivering a baby by a cesarean. In the past, if a woman had a cesarean delivery, all births afterward would be done by cesarean delivery. This is no longer true. It can be safe for the mother to try a vaginal delivery after having a cesarean delivery. It is important to discuss VBAC with your health care provider early in the pregnancy so you can understand the risks, benefits, and options. It will give you time to decide what is best in your particular case. The final decision about whether to have a VBAC or repeat cesarean delivery should be between you and your health care provider. Any changes in your health or your baby's health during your pregnancy may make it necessary to change your initial decision about VBAC. Women who plan to have a VBAC should check with their health care provider to be sure that:  The previous cesarean delivery was done with a low transverse uterine cut (incision) (not a vertical classical incision).  The birth canal is big enough for the baby.  There were no other operations on the uterus.  An electronic fetal monitor (EFM) will be on at all times during labor.  An operating room will be available and ready in case an emergency cesarean delivery is needed.  A health care provider and surgical nursing staff will be available at all times during labor to be ready to do an emergency delivery cesarean if necessary.  An anesthesiologist will be present in case an emergency cesarean delivery is needed.  The nursery is prepared and has adequate personnel and necessary equipment available to care for the baby in case of an emergency cesarean delivery. Benefits of VBAC  Shorter stay in the hospital.  Avoidance of risks associated with cesarean delivery, such as:  Surgical complications, such as opening of the incision or hernia in the incision.  Injury to other organs.  Fever. This can occur if an  infection develops after surgery. It can also occur as a reaction to the medicine given to make you numb during the surgery.  Less blood loss and need for blood transfusions.  Lower risk of blood clots and infection.  Shorter recovery.  Decreased risk for having to remove the uterus (hysterectomy).  Decreased risk for  the placenta to completely or partially cover the opening of the uterus (placenta previa) with a future pregnancy.  Decrease risk in future labor and delivery. Risks of a VBAC  Tearing (rupture) of the uterus. This is occurs in less than 1% of VBACs. The risk of this happening is higher if:  Steps are taken to begin the labor process (induce labor) or stimulate or strengthen contractions (augment labor).  Medicine is used to soften (ripen) the cervix.  Having to remove the uterus (hysterectomy) if it ruptures. VBAC should not be done if:  The previous cesarean delivery was done with a vertical (classical) or T-shaped incision or you do not know what kind of incision was made.  You had a ruptured uterus.  You have had certain types of surgery on your uterus, such as removal of uterine fibroids. Ask your health care provider about other types of surgeries that prevent you from having a VBAC.  You have certain medical or childbirth (obstetrical) problems.  There are problems with the baby.  You have had two previous cesarean deliveries and no vaginal deliveries. Other facts to know about VBAC:  It is safe to have an epidural anesthetic with VBAC.  It is safe to turn the baby from a breech position (attempt an external cephalic version).  It is safe to try a VBAC with twins.  VBAC may not be successful if your baby weights 8.8 lb (4 kg) or more. However, weight predictions are not always accurate and should not be used alone to decide if VBAC is right for you.  There is an increased failure rate if the time between the cesarean delivery and VBAC is less than 19  months.  Your health care provider may advise against a VBAC if you have preeclampsia (high blood pressure, protein in the urine, and swelling of face and extremities).  VBAC is often successful if you previously gave birth vaginally.  VBAC is often successful when the labor starts spontaneously before the due date.  Delivering a baby through a VBAC is similar to having a normal spontaneous vaginal delivery. This information is not intended to replace advice given to you by your health care provider. Make sure you discuss any questions you have with your health care provider. Document Released: 12/16/2006 Document Revised: 12/01/2015 Document Reviewed: 01/22/2013 Elsevier Interactive Patient Education  2017 [employer]

## 2016-07-05 ENCOUNTER — Other Ambulatory Visit (HOSPITAL_COMMUNITY)
Admission: RE | Admit: 2016-07-05 | Discharge: 2016-07-05 | Disposition: A | Discharge status: Home / Self Care | Payer: Medicaid Other | Source: Ambulatory Visit | Attending: Family Medicine | Admitting: Family Medicine

## 2016-07-05 ENCOUNTER — Ambulatory Visit (INDEPENDENT_AMBULATORY_CARE_PROVIDER_SITE_OTHER): Payer: Medicaid Other | Admitting: Family Medicine

## 2016-07-05 VITALS — BP 108/58 | HR 99 | Temp 98.2°F | Wt 189.0 lb

## 2016-07-05 DIAGNOSIS — Z113 Encounter for screening for infections with a predominantly sexual mode of transmission: Secondary | ICD-10-CM | POA: Insufficient documentation

## 2016-07-05 DIAGNOSIS — Z3493 Encounter for supervision of normal pregnancy, unspecified, third trimester: Secondary | ICD-10-CM

## 2016-07-05 LAB — OB RESULTS CONSOLE GBS: STREP GROUP B AG: NEGATIVE

## 2016-07-05 NOTE — Progress Notes (Signed)
Sabrina Buckley is a 30 y.o. [id number] at 72w2dfor routine follow up.  Sabrina Buckley reports no concerns. See flow sheet for details.  A/P: Pregnancy at 344w2d Doing well.   Pregnancy issues include: 1) Obesity - Weight down 3 pounds since last visit.  2) Wants TOLAC. Met with Dr PrKennon Roundst 33w and consent signed.   3) Routine Prenatal care: Infant feeding choice: Breastfeeding Contraception choice: BTL Infant circumcision desired not applicable  Tdap was given previously. GBS/GC/CZ testing was performed today.  Preterm labor precautions reviewed. Safe sleep discussed. Kick counts reviewed. Follow up 1 week.

## 2016-07-05 NOTE — Patient Instructions (Signed)

## 2016-07-06 LAB — CERVICOVAGINAL ANCILLARY ONLY
CHLAMYDIA, DNA PROBE: NEGATIVE
NEISSERIA GONORRHEA: NEGATIVE

## 2016-07-07 LAB — CULTURE, BETA STREP (GROUP B ONLY)

## 2016-07-09 NOTE — L&D Delivery Note (Signed)
OB Delivery Note  31 y.o. [id number] at 2543w0d delivered a viable female infant in cephalic, ROA position. There was no nuchal cord. The  anterior shoulder delivered with ease. A 60 sec delayed cord clamping was performed. Cord clamped x2 and cut. Placenta delivered spontaneously intact, with 3VC. Fundus firm on exam with massage and pitocin. Good hemostasis noted.  Anesthesia: Epidural Laceration: NOne Suture: N/A Good hemostasis noted. EBL: 100 cc  Mom and baby recovering in LDR.    Apgars: APGAR (1 MIN): 9 APGAR (5 MINS):  9 Weight: Pending skin to skin    Gorden HarmsMegan Maui Britten, MD PGY-2 07/31/2016, 11:40 AM

## 2016-07-11 ENCOUNTER — Encounter: Payer: Self-pay | Admitting: Family Medicine

## 2016-07-18 ENCOUNTER — Ambulatory Visit (INDEPENDENT_AMBULATORY_CARE_PROVIDER_SITE_OTHER): Payer: Medicaid Other | Admitting: Family Medicine

## 2016-07-18 VITALS — BP 110/78 | HR 96 | Temp 98.5°F | Wt 192.0 lb

## 2016-07-18 DIAGNOSIS — Z3493 Encounter for supervision of normal pregnancy, unspecified, third trimester: Secondary | ICD-10-CM

## 2016-07-18 NOTE — Progress Notes (Signed)
Sabrina Buckley is a 31 y.o. [id number] at 110w1dfor routine follow up.  She reports more pelvic pressure since this morning. No LOF or VB. Good FM. No contractions.  See flow sheet for details.  A/P: Pregnancy at [redacted]w[redacted]d Doing well.   Pregnancy issues include: 1) Obesity - Up 3 pounds since last visit.  2) Wants TOLAC. Met with Dr PrKennon Roundst 33w visit and signed consent.  3) Routine Prenatal Care: Infant feeding choice: Breast Contraception choice: BTL GBS/GC/CZ results were reviewed today.   Labor precautions reviewed. Kick counts reviewed. Follow up 1 week.

## 2016-07-18 NOTE — Patient Instructions (Signed)

## 2016-07-23 ENCOUNTER — Ambulatory Visit (INDEPENDENT_AMBULATORY_CARE_PROVIDER_SITE_OTHER): Payer: Medicaid Other | Admitting: Internal Medicine

## 2016-07-23 VITALS — BP 118/70 | HR 90 | Temp 98.3°F | Wt 193.2 lb

## 2016-07-23 DIAGNOSIS — Z3483 Encounter for supervision of other normal pregnancy, third trimester: Secondary | ICD-10-CM

## 2016-07-23 NOTE — Progress Notes (Signed)
BERNETTA SUTLEY is a 31 y.o. [id number] at 33w6dhere for routine follow up.  She reports good fetal movement and mild occasional pelvic pressure. No strong, regular contractions, no vaginal bleeding, no leakage of fluids. See flow sheet for details.  A/P: Pregnancy at 359w6dDoing well.   Pregnancy issues include: 1. Obesity- weight up 1lb since last visit 2. Desires TOLAC. Pt met with Dr. PrKennon Roundsnd has signed a consent form. Previous successful vaginal delivery with 1st child.  Infant feeding choice: Both breast and bottle Contraception choice: BTL Infant circumcision desired: not applicable  GBS and gc/chlamydia testing results were reviewed today.   Labor and fetal movement precautions reviewed. Follow up 1 week. Will need to schedule bi-weekly NSTs at that visit.

## 2016-07-23 NOTE — Patient Instructions (Signed)
It was so nice to meet you!  Everything looked great today! Please go to the Emergency [facility] if you start having strong, regular contractions every 2-3 minutes, if you notice a big gush of fluid like your water broke, or if you feel like your baby is not moving.  We will see you back in 1 week!  -Dr. Nancy MarusMayo   Introduction What is a fetal movement count? A fetal movement count is the number of times that you feel your baby move during a certain amount of time. This may also be called a fetal kick count. A fetal movement count is recommended for every pregnant woman. You may be asked to start counting fetal movements as early as week 28 of your pregnancy. Pay attention to when your baby is most active. You may notice your baby's sleep and wake cycles. You may also notice things that make your baby move more. You should do a fetal movement count:  When your baby is normally most active.  At the same time each day. A good time to count movements is while you are resting, after having something to eat and drink. How do I count fetal movements? 1. Find a quiet, comfortable area. Sit, or lie down on your side. 2. Write down the date, the start time and stop time, and the number of movements that you felt between those two times. Take this information with you to your health care visits. 3. For 2 hours, count kicks, flutters, swishes, rolls, and jabs. You should feel at least 10 movements during 2 hours. 4. You may stop counting after you have felt 10 movements. 5. If you do not feel 10 movements in 2 hours, have something to eat and drink. Then, keep resting and counting for 1 hour. If you feel at least 4 movements during that hour, you may stop counting. Contact a health care provider if:  You feel fewer than 4 movements in 2 hours.  Your baby is not moving like he or she usually does. Document Released: 07/25/2006 Document Revised: 02/22/2016 Document Reviewed:  08/04/2015 Elsevier Interactive Patient Education  2017 [employer]

## 2016-07-30 ENCOUNTER — Ambulatory Visit (INDEPENDENT_AMBULATORY_CARE_PROVIDER_SITE_OTHER): Payer: Medicaid Other | Admitting: Internal Medicine

## 2016-07-30 VITALS — BP 108/64 | HR 97 | Temp 98.4°F | Wt 192.0 lb

## 2016-07-30 DIAGNOSIS — Z3483 Encounter for supervision of other normal pregnancy, third trimester: Secondary | ICD-10-CM

## 2016-07-30 NOTE — Progress Notes (Signed)
Sabrina CorningDominique N Buckley is a 31 y.o. [id number] at 8556w6d here for routine follow up.  She reports fetal movement. No vaginal bleeding, no leakage of fluids, no contractions. Feeling some occasional pelvic pressure. See flow sheet for details.  A/P: Pregnancy at 3556w6d. Doing well.   Pregnancy issues include: 1. Obesity- weight stable over the last 2 months 2. Wants TOLAC- had appointment with Dr. Shawnie PonsPratt and signed consent  Infant feeding choice: Both Contraception choice: BTL Infant circumcision desired: not applicable  GBS and gc/chlamydia testing results were reviewed today.   Labor and fetal movement precautions reviewed. Pt scheduled for bi-weekly NST w/ AFI Postdates induction scheduled for 1/30 at 7:30AM Follow up 1 week.

## 2016-07-30 NOTE — Patient Instructions (Signed)
It was so nice to see you!  Everything is looking great! Your cervix is thin and is starting to dilate.   Please make sure you go to the emergency department if you have vaginal bleeding, strong contractions every 2-3 minutes, or a big gush of fluid like your water broke.  We will schedule you for two NSTs this week. We will also go ahead and schedule you for an induction on January 30th.  We will see you back in 1 week.  -Dr. Nancy MarusMayo

## 2016-07-31 ENCOUNTER — Encounter (HOSPITAL_COMMUNITY): Payer: Self-pay | Admitting: *Deleted

## 2016-07-31 ENCOUNTER — Inpatient Hospital Stay (HOSPITAL_COMMUNITY): Payer: Medicaid Other | Admitting: Anesthesiology

## 2016-07-31 ENCOUNTER — Inpatient Hospital Stay (HOSPITAL_COMMUNITY)
Admission: AD | Admit: 2016-07-31 | Discharge: 2016-08-02 | DRG: 775 | Disposition: A | Discharge status: Home / Self Care | Payer: Medicaid Other | Source: Ambulatory Visit | Attending: Family Medicine | Admitting: Family Medicine

## 2016-07-31 ENCOUNTER — Encounter (HOSPITAL_COMMUNITY): Payer: Self-pay | Admitting: Anesthesiology

## 2016-07-31 DIAGNOSIS — O99214 Obesity complicating childbirth: Secondary | ICD-10-CM | POA: Diagnosis present

## 2016-07-31 DIAGNOSIS — Z3493 Encounter for supervision of normal pregnancy, unspecified, third trimester: Secondary | ICD-10-CM | POA: Diagnosis present

## 2016-07-31 DIAGNOSIS — E669 Obesity, unspecified: Secondary | ICD-10-CM | POA: Diagnosis present

## 2016-07-31 DIAGNOSIS — F17210 Nicotine dependence, cigarettes, uncomplicated: Secondary | ICD-10-CM | POA: Diagnosis present

## 2016-07-31 DIAGNOSIS — O99334 Smoking (tobacco) complicating childbirth: Secondary | ICD-10-CM | POA: Diagnosis present

## 2016-07-31 DIAGNOSIS — O34211 Maternal care for low transverse scar from previous cesarean delivery: Secondary | ICD-10-CM | POA: Diagnosis present

## 2016-07-31 DIAGNOSIS — O34219 Maternal care for unspecified type scar from previous cesarean delivery: Secondary | ICD-10-CM

## 2016-07-31 DIAGNOSIS — Z3A40 40 weeks gestation of pregnancy: Secondary | ICD-10-CM | POA: Diagnosis not present

## 2016-07-31 DIAGNOSIS — Z6833 Body mass index (BMI) 33.0-33.9, adult: Secondary | ICD-10-CM | POA: Diagnosis not present

## 2016-07-31 LAB — AMNISURE RUPTURE OF MEMBRANE (ROM) NOT AT ARMC: Amnisure ROM: POSITIVE

## 2016-07-31 LAB — TYPE AND SCREEN
ABO/RH(D): O POS
ANTIBODY SCREEN: NEGATIVE

## 2016-07-31 LAB — CBC
HCT: 32.1 % — ABNORMAL LOW (ref 36.0–46.0)
HEMOGLOBIN: 10.6 g/dL — AB (ref 12.0–15.0)
MCH: 28.6 pg (ref 26.0–34.0)
MCHC: 33 g/dL (ref 30.0–36.0)
MCV: 86.5 fL (ref 78.0–100.0)
PLATELETS: 239 10*3/uL (ref 150–400)
RBC: 3.71 MIL/uL — AB (ref 3.87–5.11)
RDW: 15.2 % (ref 11.5–15.5)
WBC: 6.8 10*3/uL (ref 4.0–10.5)

## 2016-07-31 LAB — RPR: RPR: NONREACTIVE

## 2016-07-31 LAB — ABO/RH: ABO/RH(D): O POS

## 2016-07-31 MED ORDER — EPHEDRINE 5 MG/ML INJ: 10.0000 mg | INTRAVENOUS | Status: DC | PRN

## 2016-07-31 MED ORDER — FENTANYL 2.5 MCG/ML BUPIVACAINE 1/10 % EPIDURAL INFUSION (WH - ANES)
14.0000 mL/h | INTRAMUSCULAR | Status: DC | PRN
  Administered 2016-07-31: 14 mL/h via EPIDURAL
  Filled 2016-07-31: qty 100

## 2016-07-31 MED ORDER — LACTATED RINGERS IV SOLN
INTRAVENOUS | Status: DC
  Administered 2016-07-31: 09:00:00 via INTRAVENOUS

## 2016-07-31 MED ORDER — LACTATED RINGERS IV SOLN
INTRAVENOUS | Status: DC
  Administered 2016-08-01: 1 mL via INTRAVENOUS

## 2016-07-31 MED ORDER — ACETAMINOPHEN 325 MG PO TABS: 650.0000 mg | ORAL_TABLET | ORAL | Status: DC | PRN

## 2016-07-31 MED ORDER — IBUPROFEN 600 MG PO TABS
600.0000 mg | ORAL_TABLET | Freq: Four times a day (QID) | ORAL | Status: DC
  Administered 2016-07-31 – 2016-08-02 (×7): 600 mg via ORAL
  Filled 2016-07-31 (×7): qty 1

## 2016-07-31 MED ORDER — TETANUS-DIPHTH-ACELL PERTUSSIS 5-2.5-18.5 LF-MCG/0.5 IM SUSP: 0.5000 mL | Freq: Once | INTRAMUSCULAR | Status: DC

## 2016-07-31 MED ORDER — ZOLPIDEM TARTRATE 5 MG PO TABS: 5.0000 mg | ORAL_TABLET | Freq: Every evening | ORAL | Status: DC | PRN

## 2016-07-31 MED ORDER — SIMETHICONE 80 MG PO CHEW: 80.0000 mg | CHEWABLE_TABLET | ORAL | Status: DC | PRN

## 2016-07-31 MED ORDER — DIBUCAINE 1 % RE OINT: 1.0000 "application " | TOPICAL_OINTMENT | RECTAL | Status: DC | PRN

## 2016-07-31 MED ORDER — PHENYLEPHRINE 40 MCG/ML (10ML) SYRINGE FOR IV PUSH (FOR BLOOD PRESSURE SUPPORT): 80.0000 ug | PREFILLED_SYRINGE | INTRAVENOUS | Status: DC | PRN

## 2016-07-31 MED ORDER — OXYCODONE-ACETAMINOPHEN 5-325 MG PO TABS: 2.0000 | ORAL_TABLET | ORAL | Status: DC | PRN

## 2016-07-31 MED ORDER — LACTATED RINGERS IV SOLN: 500.0000 mL | Freq: Once | INTRAVENOUS | Status: DC

## 2016-07-31 MED ORDER — SENNOSIDES-DOCUSATE SODIUM 8.6-50 MG PO TABS
2.0000 | ORAL_TABLET | ORAL | Status: DC
  Administered 2016-07-31 – 2016-08-02 (×2): 2 via ORAL
  Filled 2016-07-31 (×2): qty 2

## 2016-07-31 MED ORDER — COCONUT OIL OIL: 1.0000 "application " | TOPICAL_OIL | Status: DC | PRN

## 2016-07-31 MED ORDER — WITCH HAZEL-GLYCERIN EX PADS: 1.0000 "application " | MEDICATED_PAD | CUTANEOUS | Status: DC | PRN

## 2016-07-31 MED ORDER — ONDANSETRON HCL 4 MG/2ML IJ SOLN: 4.0000 mg | Freq: Four times a day (QID) | INTRAMUSCULAR | Status: DC | PRN

## 2016-07-31 MED ORDER — DIPHENHYDRAMINE HCL 25 MG PO CAPS: 25.0000 mg | ORAL_CAPSULE | Freq: Four times a day (QID) | ORAL | Status: DC | PRN

## 2016-07-31 MED ORDER — LIDOCAINE HCL (PF) 1 % IJ SOLN
INTRAMUSCULAR | Status: DC | PRN
  Administered 2016-07-31: 7 mL via EPIDURAL
  Administered 2016-07-31: 5 mL via EPIDURAL

## 2016-07-31 MED ORDER — DIPHENHYDRAMINE HCL 50 MG/ML IJ SOLN: 12.5000 mg | INTRAMUSCULAR | Status: DC | PRN

## 2016-07-31 MED ORDER — DEXTROSE-NACL 5-0.45 % IV SOLN
INTRAVENOUS | Status: AC
  Administered 2016-08-01: 06:00:00 via INTRAVENOUS

## 2016-07-31 MED ORDER — FAMOTIDINE 20 MG PO TABS
40.0000 mg | ORAL_TABLET | Freq: Once | ORAL | Status: AC
  Administered 2016-08-01: 40 mg via ORAL
  Filled 2016-07-31: qty 2

## 2016-07-31 MED ORDER — BENZOCAINE-MENTHOL 20-0.5 % EX AERO: 1.0000 "application " | INHALATION_SPRAY | CUTANEOUS | Status: DC | PRN

## 2016-07-31 MED ORDER — METOCLOPRAMIDE HCL 10 MG PO TABS
10.0000 mg | ORAL_TABLET | Freq: Once | ORAL | Status: AC
  Administered 2016-08-01: 10 mg via ORAL
  Filled 2016-07-31: qty 1

## 2016-07-31 MED ORDER — OXYTOCIN 40 UNITS IN LACTATED RINGERS INFUSION - SIMPLE MED
2.5000 [IU]/h | INTRAVENOUS | Status: DC
  Administered 2016-07-31: 39.96 [IU]/h via INTRAVENOUS
  Filled 2016-07-31: qty 1000

## 2016-07-31 MED ORDER — ONDANSETRON HCL 4 MG PO TABS: 4.0000 mg | ORAL_TABLET | ORAL | Status: DC | PRN

## 2016-07-31 MED ORDER — NALBUPHINE HCL 10 MG/ML IJ SOLN: 5.0000 mg | INTRAMUSCULAR | Status: DC | PRN

## 2016-07-31 MED ORDER — PHENYLEPHRINE 40 MCG/ML (10ML) SYRINGE FOR IV PUSH (FOR BLOOD PRESSURE SUPPORT)
80.0000 ug | PREFILLED_SYRINGE | INTRAVENOUS | Status: DC | PRN
  Filled 2016-07-31: qty 10

## 2016-07-31 MED ORDER — LACTATED RINGERS IV SOLN
500.0000 mL | INTRAVENOUS | Status: DC | PRN
  Administered 2016-07-31: 1000 mL via INTRAVENOUS

## 2016-07-31 MED ORDER — PRENATAL MULTIVITAMIN CH
1.0000 | ORAL_TABLET | Freq: Every day | ORAL | Status: DC
  Administered 2016-08-01: 1 via ORAL
  Filled 2016-07-31: qty 1

## 2016-07-31 MED ORDER — SOD CITRATE-CITRIC ACID 500-334 MG/5ML PO SOLN: 30.0000 mL | ORAL | Status: DC | PRN

## 2016-07-31 MED ORDER — LIDOCAINE HCL (PF) 1 % IJ SOLN
30.0000 mL | INTRAMUSCULAR | Status: DC | PRN
  Filled 2016-07-31: qty 30

## 2016-07-31 MED ORDER — OXYTOCIN BOLUS FROM INFUSION
500.0000 mL | Freq: Once | INTRAVENOUS | Status: AC
  Administered 2016-07-31: 500 mL via INTRAVENOUS

## 2016-07-31 MED ORDER — ONDANSETRON HCL 4 MG/2ML IJ SOLN: 4.0000 mg | INTRAMUSCULAR | Status: DC | PRN

## 2016-07-31 MED ORDER — OXYCODONE-ACETAMINOPHEN 5-325 MG PO TABS: 1.0000 | ORAL_TABLET | ORAL | Status: DC | PRN

## 2016-07-31 NOTE — Progress Notes (Signed)
S: Patient seen & examined for progress of labor. Patient comfortable with epidural.    O:  Vitals:   07/31/16 0850 07/31/16 0855 07/31/16 0900 07/31/16 0905  BP:      Pulse:      Resp: 16 16 16 16   Temp:      TempSrc:      Weight:      Height:        Dilation: 8 Effacement (%): 100 Cervical Position: Middle Station: 0 Presentation: Vertex Exam by:: Dr Orvan Falconerampbell   FHT: 140sbpm, mod var, +accels, no decels TOCO: q 2-643min   A/P: AROM with moderate amount of clear fluid Continue expectant management Anticipate SVD   Gorden HarmsMegan Marcas Bowsher, MD PGY-2 07/31/2016 9:24 AM

## 2016-07-31 NOTE — Lactation Note (Signed)
This note was copied from a baby's chart. Lactation Consultation Note  Patient Name: Sabrina Buckley ZOXWR'UToday's Date: 07/31/2016 Reason for consult: Initial assessment Baby at 6 hr of life. RN reported that mom seems unsure about bf. Upon entry baby was sleeping in visitor's arms. Mom reports baby just came off the breast and "did good". She denies breast or nipple pain, voiced no concerns. Discussed baby behavior, feeding frequency, baby belly size, voids, wt loss, breast changes, and nipple care. Mom stated she can manually express and has spoon in room. Given lactation handouts. Aware of OP services and support group. She will call as needed for latch help.     Maternal Data Has patient been taught Hand Expression?: Yes  Feeding    LATCH Score/Interventions                      Lactation Tools Discussed/Used     Consult Status Consult Status: Follow-up Date: 08/01/16 Follow-up type: In-patient    [name] 07/31/2016, 5:52 PM

## 2016-07-31 NOTE — Anesthesia Procedure Notes (Signed)
Epidural Patient location during procedure: OB Start time: 07/31/2016 8:34 AM End time: 07/31/2016 8:38 AM  Staffing Anesthesiologist: Leilani AbleHATCHETT, Nyheim Seufert Performed: anesthesiologist   Preanesthetic Checklist Completed: patient identified, surgical consent, pre-op evaluation, timeout performed, IV checked, risks and benefits discussed and monitors and equipment checked  Epidural Patient position: sitting Prep: site prepped and draped and DuraPrep Patient monitoring: continuous pulse ox and blood pressure Approach: midline Location: L3-L4 Injection technique: LOR air  Needle:  Needle type: Tuohy  Needle gauge: 17 G Needle length: 9 cm and 9 Needle insertion depth: 7 cm Catheter type: closed end flexible Catheter size: 19 Gauge Catheter at skin depth: 12 cm Test dose: negative and Other  Assessment Sensory level: T10 Events: blood not aspirated, injection not painful, no injection resistance, negative IV test and no paresthesia  Additional Notes Reason for block:procedure for pain

## 2016-07-31 NOTE — MAU Note (Addendum)
PT  SAYS UC  STARTED  AT 0530.    ALSO  NOTICED FLUID  RUN DOWN HER  LEG   WHEN  GOING  TO B-ROOM   .  PNC-  MCFP-   .    DENIES  HSV AND MRSA.   GBS- NEG.  PLANS  FOR  VAG   DEL   VE YESTERDAY-  1  CM

## 2016-07-31 NOTE — Anesthesia Preprocedure Evaluation (Signed)
Anesthesia Evaluation  Patient identified by MRN, date of birth, ID band Patient awake    Reviewed: Allergy & Precautions, H&P , NPO status , Patient's Chart, lab work & pertinent test results  Airway Mallampati: II  TM Distance: >3 FB Neck ROM: full    Dental no notable dental hx.    Pulmonary Current Smoker,    Pulmonary exam normal        Cardiovascular negative cardio ROS Normal cardiovascular exam     Neuro/Psych negative neurological ROS     GI/Hepatic Neg liver ROS,   Endo/Other  negative endocrine ROS  Renal/GU negative Renal ROS     Musculoskeletal   Abdominal Normal abdominal exam  (+) + obese,   Peds  Hematology negative hematology ROS (+)   Anesthesia Other Findings   Reproductive/Obstetrics (+) Pregnancy                             Anesthesia Physical Anesthesia Plan  ASA: II  Anesthesia Plan: Epidural   Post-op Pain Management:    Induction:   Airway Management Planned:   Additional Equipment:   Intra-op Plan:   Post-operative Plan:   Informed Consent: I have reviewed the patients History and Physical, chart, labs and discussed the procedure including the risks, benefits and alternatives for the proposed anesthesia with the patient or authorized representative who has indicated his/her understanding and acceptance.     Plan Discussed with:   Anesthesia Plan Comments:         Anesthesia Quick Evaluation

## 2016-07-31 NOTE — Anesthesia Pain Management Evaluation Note (Signed)
  CRNA Pain Management Visit Note  Patient: Sabrina Buckley, 31 y.o., female  "Hello I am a member of the anesthesia team at [facility]. We have an anesthesia team available at all times to provide care throughout the hospital, including epidural management and anesthesia for C-section. I don't know your plan for the delivery whether it a natural birth, water birth, IV sedation, nitrous supplementation, doula or epidural, but we want to meet your pain goals."   1.Was your pain managed to your expectations on prior hospitalizations?   Yes   2.What is your expectation for pain management during this hospitalization?     Epidural  3.How can we help you reach that goal? Epidural placed  Record the patient's initial score and the patient's pain goal.   Pain: 0  Pain Goal: 9 [facility] wants you to be able to say your pain was always managed very well.  Edison PaceWILKERSON,Kimari Coudriet 07/31/2016

## 2016-07-31 NOTE — H&P (Signed)
LABOR AND DELIVERY ADMISSION HISTORY AND PHYSICAL NOTE  Sabrina Buckley is a 31 y.o. female [phone] with IUP at 7452w0d by LMP and 12 week ultrasound presenting for spontaneous onset of labor, with increasing uterine contractions. She also noticed that she started having fluid running down her leg.    She reports positive fetal movement. She denies vaginal bleeding.  Prenatal History/Complications: Tobacco use  TOLAC Obesity  Past Medical History: Past Medical History:  Diagnosis Date  . Chlamydia   . Vaginal Pap smear, abnormal   . Ventral hernia     Past Surgical History: Past Surgical History:  Procedure Laterality Date  . CESAREAN SECTION    . HERNIA REPAIR    . UMBILICAL HERNIA REPAIR N/A 2013    Obstetrical History: OB History    Gravida Para Term Preterm AB Living   4 3 2 1 1 3    SAB TAB Ectopic Multiple Live Births   1     0 3      Social History: Social History   Social History  . Marital status: Single    Spouse name: N/A  . Number of children: N/A  . Years of education: N/A   Social History Main Topics  . Smoking status: Current Every Day Smoker    Packs/day: 0.25    Types: Cigarettes  . Smokeless tobacco: Never Used  . Alcohol use No  . Drug use: No  . Sexual activity: Yes    Birth control/ protection: None     Comment: No condom use   Other Topics Concern  . None   Social History Narrative  . None    Family History: History reviewed. No pertinent family history.  Allergies: No Known Allergies  Prescriptions Prior to Admission  Medication Sig Dispense Refill Last Dose  . ferrous sulfate 325 (65 FE) MG tablet Take 1 tablet (325 mg total) by mouth 2 (two) times daily with a meal. 60 tablet 3 07/30/2016 at Unknown time  . Prenatal MV-Min-FA-Omega-3 (PRENATAL GUMMIES/DHA & FA) 0.4-32.5 MG CHEW Chew 1 tablet by mouth daily.   07/29/2016  . pyridOXINE (VITAMIN B-6) 25 MG tablet Take 1 tablet (25 mg total) by mouth every 8 (eight) hours  as needed (nausea). 60 tablet 0 Past Month at Unknown time  . acetaminophen (TYLENOL) 325 MG tablet Take 2 tablets (650 mg total) by mouth every 6 (six) hours as needed for moderate pain (pain). 30 tablet 0 07/27/2016  . Prenatal Vit-Fe Fumarate-FA (MULTIVITAMIN-PRENATAL) 27-0.8 MG TABS tablet Take 1 tablet by mouth daily at 12 noon. (Patient not taking: Reported on 07/31/2016) 30 each 0 Not Taking at Unknown time     Review of Systems   All systems reviewed and negative except as stated in HPI  Blood pressure 135/82, pulse 62, temperature 98.4 F (36.9 C), temperature source Oral, resp. rate 17, height 5\' 3"  (1.6 m), weight 191 lb (86.6 kg), last menstrual period 10/25/2015, unknown if currently breastfeeding. General appearance: alert, cooperative and no distress Lungs: clear to auscultation bilaterally Heart: regular rate and rhythm Abdomen: soft, non-tender; bowel sounds normal Extremities: No calf swelling or tenderness Presentation: cephalic Fetal monitoring: Baseline in the 140s, +accels, - decels, mod variability Uterine activity: contractions q 2minutes Dilation: 10 Effacement (%): 100 Station: +2 Exam by:: Ferne CoeS Earl RNC   Prenatal labs: ABO, Rh: --/--/O POS, O POS (01/23 0755) Antibody: NEG (01/23 0755) Rubella: Immune RPR: Non Reactive (01/23 0755)  HBsAg: NEGATIVE (06/13 1105)  HIV: NONREACTIVE (11/10 1508)  GBS: Negative (12/28 0000)  3hr GTT: Normal  Genetic screening:  Not performed Anatomy US: Normal girl  Prenatal Transfer Tool  Maternal Diabetes: No Genetic Screening: Declined Maternal Ultrasounds/Referrals: Normal Fetal Ultrasounds or other Referrals:  None Maternal Substance Abuse:  Yes:  Type: Smoker Significant Maternal Medications:  None Significant Maternal Lab Results: Lab values include: Group B Strep negative  Results for orders placed or performed during the hospital encounter of 07/31/16 (from the past 24 hour(s))  Amnisure rupture of membrane  (rom)not at [facility]   Collection Time: 07/31/16  7:28 AM  Result Value Ref Range   Amnisure ROM POSITIVE   CBC   Collection Time: 07/31/16  7:55 AM  Result Value Ref Range   WBC 6.8 4.0 - 10.5 K/uL   RBC 3.71 (L) 3.87 - 5.11 MIL/uL   Hemoglobin 10.6 (L) 12.0 - 15.0 g/dL   HCT 16.1 (L) 09.6 - 04.5 %   MCV 86.5 78.0 - 100.0 fL   MCH 28.6 26.0 - 34.0 pg   MCHC 33.0 30.0 - 36.0 g/dL   RDW 40.9 81.1 - 91.4 %   Platelets 239 150 - 400 K/uL  RPR   Collection Time: 07/31/16  7:55 AM  Result Value Ref Range   RPR Ser Ql Non Reactive Non Reactive  Type and screen [facility] [place]   Collection Time: 07/31/16  7:55 AM  Result Value Ref Range   ABO/RH(D) O POS    Antibody Screen NEG    Sample Expiration 08/03/2016   ABO/Rh   Collection Time: 07/31/16  7:55 AM  Result Value Ref Range   ABO/RH(D) O POS     Patient Active Problem List   Diagnosis Date Noted  . Normal labor 07/31/2016  . Previous cesarean delivery, antepartum condition or complication 06/18/2016  . Encounter for supervision of other normal pregnancy 01/17/2016  . OBESITY, UNSPECIFIED 12/14/2008  . VENTRAL HERNIA 08/19/2008  . GERD 08/04/2008  . TOBACCO DEPENDENCE 09/05/2006    Assessment: ATIA HAUPT is a 31 y.o. [id number] at [redacted]w[redacted]d here for spontaneous onset of labor with rupture of membranes.   #labor: Admitted for expectant management.  #Pain: Patient desires an epidural when appropriate.  #FWB: Cat I #ID:  GBS negative. No indication for abx.  #MOF: Breast and bottle #MOC: BTL, consent signed #Circ:  N/A  Lise Auer, MD PGY-2 07/31/2016, 8:10 PM

## 2016-08-01 ENCOUNTER — Encounter (HOSPITAL_COMMUNITY)
Admission: AD | Disposition: A | Discharge status: Home / Self Care | Payer: Self-pay | Source: Ambulatory Visit | Attending: Family Medicine

## 2016-08-01 ENCOUNTER — Other Ambulatory Visit: Payer: Medicaid Other

## 2016-08-01 SURGERY — LIGATION, FALLOPIAN TUBE, POSTPARTUM
Anesthesia: Spinal | Site: Abdomen

## 2016-08-01 MED ORDER — LIDOCAINE HCL (CARDIAC) 20 MG/ML IV SOLN
INTRAVENOUS | Status: AC
  Filled 2016-08-01: qty 5

## 2016-08-01 MED ORDER — FENTANYL CITRATE (PF) 250 MCG/5ML IJ SOLN
INTRAMUSCULAR | Status: AC
  Filled 2016-08-01: qty 5

## 2016-08-01 MED ORDER — PROPOFOL 10 MG/ML IV BOLUS
INTRAVENOUS | Status: AC
  Filled 2016-08-01: qty 20

## 2016-08-01 MED ORDER — MIDAZOLAM HCL 2 MG/2ML IJ SOLN
INTRAMUSCULAR | Status: AC
  Filled 2016-08-01: qty 2

## 2016-08-01 MED ORDER — ONDANSETRON HCL 4 MG/2ML IJ SOLN
INTRAMUSCULAR | Status: AC
  Filled 2016-08-01: qty 2

## 2016-08-01 NOTE — Progress Notes (Signed)
Post Partum Day 1 Subjective: no complaints, up ad lib and voiding  Objective: Blood pressure 122/85, pulse 68, temperature 98.1 F (36.7 C), temperature source Oral, resp. rate 18, height 5\' 3"  (1.6 m), weight 191 lb (86.6 kg), last menstrual period 10/25/2015, unknown if currently breastfeeding.  Physical Exam:  General: alert, cooperative and no distress Lochia: appropriate Uterine Fundus: firm Incision: healing well DVT Evaluation: No evidence of DVT seen on physical exam.   Recent Labs  07/31/16 0755  HGB 10.6*  HCT 32.1*    Assessment/Plan: Plan for discharge tomorrow and Breastfeeding  Planning to have BTL today Prefers to go home tomorrow   LOS: 1 day   Sabrina Buckley 08/01/2016, 7:20 AM

## 2016-08-01 NOTE — Anesthesia Postprocedure Evaluation (Signed)
Anesthesia Post Note  Patient: Sabrina Buckley  Procedure(s) Performed: * No procedures listed *  Patient location during evaluation: Mother Baby Anesthesia Type: Epidural Level of consciousness: awake, awake and alert, oriented and patient cooperative Pain management: pain level controlled Vital Signs Assessment: post-procedure vital signs reviewed and stable Respiratory status: spontaneous breathing, nonlabored ventilation and respiratory function stable Cardiovascular status: stable Postop Assessment: no headache, no backache, patient able to bend at knees and no signs of nausea or vomiting Anesthetic complications: no        Last Vitals:  Vitals:   08/01/16 0542 08/01/16 0755  BP: 122/85 130/84  Pulse: 68 72  Resp: 18 18  Temp: 36.7 C 36.8 C    Last Pain:  Vitals:   08/01/16 0755  TempSrc: Oral  PainSc:    Pain Goal:                 [name]

## 2016-08-01 NOTE — Lactation Note (Signed)
This note was copied from a baby's chart. Lactation Consultation Note  Patient Name: Sabrina Lear NgDominique Deam WUJWJ'XToday's Date: 08/01/2016 Reason for consult: Follow-up assessment   Follow up with mom of 22 hour old infant. Infant with 1 BF, 4 bottle feeds of formula of 12-27 cc, 1 void and 3 stools since birth. Infant weight 6 lb 10.5 oz. LATCH Score 8.   Mom reports she would like to BF but has not been able to get infant to latch consistently. Mom reports she did not have success BF her other children who are 14 and 10.  Infant last fed around 7:30 am. Awakened infant to feed. Assisted mom in latching infant to left breast in the cross cradle hold. Infant latched easily with flanged lips, rhythmic suckles and intermittent swallows. Mom reports this is the best she has done. Discussed supply and demand and milk coming to volume and enc mom to attempt to BF prior to offering bottle.   DEBP set up with mom's consent. Showed mom how to use on Initiate setting, assemble, disassemble and clean pump parts. Enc mom to pump every 2-3 hours post BF or if not putting infant to the breast to encourage milk supply.   Enc mom to call out for feeding assistance as needed. [facility] and does not have a breast pump at home.    Maternal Data Formula Feeding for Exclusion: No Has patient been taught Hand Expression?: Yes Does the patient have breastfeeding experience prior to this delivery?: No (reports her other 2 would not latch)  Feeding Feeding Type: Breast Fed Length of feed: 15 min  LATCH Score/Interventions Latch: Grasps breast easily, tongue down, lips flanged, rhythmical sucking.  Audible Swallowing: A few with stimulation Intervention(s): Skin to skin;Hand expression;Alternate breast massage  Type of Nipple: Everted at rest and after stimulation  Comfort (Breast/Nipple): Soft / non-tender     Hold (Positioning): Assistance needed to correctly position infant at breast and  maintain latch. Intervention(s): Breastfeeding basics reviewed;Support Pillows;Position options;Skin to skin  LATCH Score: 8  Lactation Tools Discussed/Used WIC Program: Yes Pump Review: Setup, frequency, and cleaning;Milk Storage Initiated by:: Noralee StainSharon Mckenzy Salazar, RN, IBCLC Date initiated:: 08/01/16   Consult Status Consult Status: Follow-up Date: 08/02/16 Follow-up type: In-patient    Sabrina Buckley 08/01/2016, 10:51 AM

## 2016-08-01 NOTE — Anesthesia Preprocedure Evaluation (Deleted)
Anesthesia Evaluation  Patient identified by MRN, date of birth, ID band Patient awake    Reviewed: Allergy & Precautions, NPO status , Patient's Chart, lab work & pertinent test results  Airway Mallampati: I  TM Distance: >3 FB Neck ROM: Full    Dental   Pulmonary Current Smoker,    Pulmonary exam normal       Cardiovascular Normal cardiovascular exam    Neuro/Psych    GI/Hepatic GERD-  Medicated and Controlled,  Endo/Other    Renal/GU      Musculoskeletal   Abdominal   Peds  Hematology   Anesthesia Other Findings   Reproductive/Obstetrics                             Anesthesia Physical Anesthesia Plan  ASA: II  Anesthesia Plan: General   Post-op Pain Management:    Induction: Intravenous  Airway Management Planned: Oral ETT  Additional Equipment:   Intra-op Plan:   Post-operative Plan: Extubation in OR  Informed Consent: I have reviewed the patients History and Physical, chart, labs and discussed the procedure including the risks, benefits and alternatives for the proposed anesthesia with the patient or authorized representative who has indicated his/her understanding and acceptance.     Plan Discussed with: CRNA and Surgeon  Anesthesia Plan Comments:         Anesthesia Quick Evaluation  

## 2016-08-01 NOTE — Progress Notes (Signed)
MOB was reminded about NPO order at 2300. Gave PO pain med at 2319 with a sip of water. At 2200 MOB ordered and ate, a tuna sandwich with lay's potato chips.

## 2016-08-02 DIAGNOSIS — Z3A40 40 weeks gestation of pregnancy: Secondary | ICD-10-CM

## 2016-08-02 DIAGNOSIS — O34211 Maternal care for low transverse scar from previous cesarean delivery: Secondary | ICD-10-CM

## 2016-08-02 MED ORDER — SENNOSIDES-DOCUSATE SODIUM 8.6-50 MG PO TABS: 2.0000 | ORAL_TABLET | ORAL | 0 refills | Status: DC

## 2016-08-02 MED ORDER — IBUPROFEN 600 MG PO TABS: 600.0000 mg | ORAL_TABLET | Freq: Four times a day (QID) | ORAL | 0 refills | Status: DC

## 2016-08-02 NOTE — Discharge Instructions (Signed)

## 2016-08-02 NOTE — Lactation Note (Signed)
This note was copied from a baby's chart. Lactation Consultation Note; Mom states she has decided to bottle feed formula only. Reports nipples are sore and even pumping was hurting. Offered assist with latch but mom states she only wants to bottle feed. Reviewed engorgement care. No questions at present.   Patient Name: Sabrina Buckley EAVWU'JToday's Date: 08/02/2016 Reason for consult: Follow-up assessment   Maternal Data    Feeding Feeding Type: Formula Nipple Type: Slow - flow  LATCH Score/Interventions Latch:  (unable to assess latch - not called to room)                    Lactation Tools Discussed/Used     Consult Status Consult Status: Complete    Pamelia HoitWeeks, Trelon Plush D 08/02/2016, 8:48 AM

## 2016-08-02 NOTE — Discharge Summary (Signed)
OB Discharge Summary     Patient Name: Sabrina Buckley DOB: 01/13/1986 MRN: [id number]  Date of admission: 07/31/2016 Delivering MD: Gorden HarmsAMPBELL, MEGAN C   Date of discharge: 08/02/2016  Admitting diagnosis: 40w, Contractions  desires sterilization Intrauterine pregnancy: 7366w0d     Secondary diagnosis:  Principal Problem:   SVD (spontaneous vaginal delivery) Active Problems:   Normal labor  Additional problems:  VBAC Ventral hernia repair with placement of mesh Tobacco Use Obesity     Discharge diagnosis: Term Pregnancy Delivered                                                                                                Post partum procedures:NOne  Augmentation: NOne  Complications: None  Hospital course:  Onset of Labor With Vaginal Delivery     31 y.o. yo [id number] at 2366w0d was admitted in Active Labor on 07/31/2016. Patient had an uncomplicated labor course as follows:  Membrane Rupture Time/Date: 9:20 AM ,07/31/2016   Intrapartum Procedures: Episiotomy: None [1]                                         Lacerations:  None [1]  Patient had a delivery of a Viable infant. 07/31/2016  Information for the patient's newborn:  Curley SpiceHightower, Girl Dondra PraderDominique [[id number]][[id number]]  Delivery Method: VBAC, Spontaneous (Filed from Delivery Summary)    Pateint had an uncomplicated postpartum course.  She is ambulating, tolerating a regular diet, passing flatus, and urinating well. Patient is discharged home in stable condition on 08/02/16.   Physical exam  Vitals:   08/01/16 0542 08/01/16 0755 08/01/16 1725 08/02/16 0550  BP: 122/85 130/84 116/73 133/88  Pulse: 68 72 74 71  Resp: 18 18 18 18   Temp: 98.1 F (36.7 C) 98.3 F (36.8 C) 98.5 F (36.9 C) 97.9 F (36.6 C)  TempSrc: Oral Oral Oral Oral  Weight:      Height:       General: alert, cooperative and no distress Lochia: appropriate Uterine Fundus: firm Incision: N/A DVT Evaluation: No evidence of DVT seen on physical  exam. Negative Homan's sign. No significant calf/ankle edema. Labs: Lab Results  Component Value Date   WBC 6.8 07/31/2016   HGB 10.6 (L) 07/31/2016   HCT 32.1 (L) 07/31/2016   MCV 86.5 07/31/2016   PLT 239 07/31/2016   CMP Latest Ref Rng & Units 04/25/2016  Glucose 65 - 104 mg/dL 80  BUN 6 - 23 mg/dL -  Creatinine 1.300.50 - 8.651.10 mg/dL -  Sodium 784135 - 696145 mEq/L -  Potassium 3.5 - 5.3 mEq/L -  Chloride 96 - 112 mEq/L -  CO2 19 - 32 mEq/L -  Calcium 8.4 - 10.5 mg/dL -    Discharge instruction: per After Visit Summary and "Baby and Me Booklet".  After visit meds:  Allergies as of 08/02/2016   No Known Allergies     Medication List    STOP taking these medications   multivitamin-prenatal 27-0.8 MG Tabs tablet  pyridOXINE 25 MG tablet Commonly known as:  VITAMIN B-6     TAKE these medications   acetaminophen 325 MG tablet Commonly known as:  TYLENOL Take 2 tablets (650 mg total) by mouth every 6 (six) hours as needed for moderate pain (pain).   ferrous sulfate 325 (65 FE) MG tablet Take 1 tablet (325 mg total) by mouth 2 (two) times daily with a meal.   ibuprofen 600 MG tablet Commonly known as:  ADVIL,MOTRIN Take 1 tablet (600 mg total) by mouth every 6 (six) hours.   PRENATAL GUMMIES/DHA & FA 0.4-32.5 MG Chew Chew 1 tablet by mouth daily.   senna-docusate 8.6-50 MG tablet Commonly known as:  Senokot-S Take 2 tablets by mouth daily. Start taking on:  08/03/2016       Diet: routine diet  Activity: Advance as tolerated. Pelvic rest for 6 weeks.   Outpatient follow up:2 weeks Follow up Appt:Future Appointments Date Time Provider Department Center  08/06/2016 1:45 PM Campbell Stall, MD [facility]   Follow up Visit:No Follow-up on file.  Postpartum contraception: Interval tubal ligation  Newborn Data: Live born female  Birth Weight: 6 lb 12.5 oz (3075 g) APGAR: 8, 9  Baby Feeding: Bottle and Breast Disposition:home with mother  Patient had  initially planned for BTL, but has had a ventral hernia repair with mesh placement. Will require interval BTL. Plan for follow-up in clinic within 2 weeks for pre-op visit.   08/02/2016 Gorden Harms, MD   CNM attestation I have seen and examined this patient and agree with above documentation in the resident's note.   Sabrina Buckley is a 31 y.o. [phone] s/p VBAC.   Pain is well controlled.  Plan for birth control is bilateral tubal ligation.  Method of Feeding: both  PE:  BP 133/88 (BP Location: Right Arm)   Pulse 71   Temp 97.9 F (36.6 C) (Oral)   Resp 18   Ht 5\' 3"  (1.6 m)   Wt 86.6 kg (191 lb)   LMP 10/25/2015 (Exact Date)   Breastfeeding? Unknown   BMI 33.83 kg/m  Fundus firm  No results for input(s): HGB, HCT in the last 72 hours.   Plan: discharge today - postpartum care discussed - f/u clinic in 2 weeks for preop BTL appt   SHAW, KIMBERLY, CNM 12:32 AM

## 2016-08-06 ENCOUNTER — Encounter: Payer: Medicaid Other | Admitting: Internal Medicine

## 2016-08-07 ENCOUNTER — Inpatient Hospital Stay (HOSPITAL_COMMUNITY): Admission: RE | Admit: 2016-08-07 | Payer: Medicaid Other | Source: Ambulatory Visit

## 2016-08-17 ENCOUNTER — Telehealth: Payer: Self-pay | Admitting: Family Medicine

## 2016-08-17 NOTE — Telephone Encounter (Signed)
Patient dropped form FMLA to be filled out. Please f/u with patient

## 2016-08-20 ENCOUNTER — Ambulatory Visit: Payer: Medicaid Other | Admitting: Internal Medicine

## 2016-08-20 NOTE — Telephone Encounter (Signed)
Placed in provider's box for review.

## 2016-08-21 NOTE — Telephone Encounter (Signed)
Patient advised that FMLA forms are complete and ready for pick up.  Forms copied for scanning in patient's record.  Clovis PuMartin, Tamika L, RN

## 2016-08-21 NOTE — Telephone Encounter (Signed)
Form completed and given to Tamika.  Katina Degreealeb M. Jimmey RalphParker, MD [facility] Resident PGY-3 08/21/2016 1:44 PM

## 2016-09-05 ENCOUNTER — Ambulatory Visit: Payer: Medicaid Other | Admitting: Student

## 2016-09-06 ENCOUNTER — Ambulatory Visit (INDEPENDENT_AMBULATORY_CARE_PROVIDER_SITE_OTHER): Payer: Medicaid Other | Admitting: Family Medicine

## 2016-09-06 VITALS — BP 112/72 | HR 85 | Temp 97.7°F | Wt 179.4 lb

## 2016-09-06 DIAGNOSIS — H01009 Unspecified blepharitis unspecified eye, unspecified eyelid: Secondary | ICD-10-CM | POA: Diagnosis not present

## 2016-09-06 MED ORDER — POLYMYXIN B-TRIMETHOPRIM 10000-0.1 UNIT/ML-% OP SOLN: 2.0000 [drp] | Freq: Four times a day (QID) | OPHTHALMIC | 0 refills | Status: DC

## 2016-09-06 MED ORDER — BACITRACIN-POLYMYXIN B 500-10000 UNIT/GM OP OINT: 1.0000 "application " | TOPICAL_OINTMENT | Freq: Two times a day (BID) | OPHTHALMIC | 0 refills | Status: DC

## 2016-09-06 NOTE — Patient Instructions (Signed)
Blepharitis Blepharitis means swollen eyelids. Follow these instructions at home: Pay attention to any changes in how you look or feel. Follow these instructions to help with your condition: Keeping Clean   Wash your hands often.  Wash your eyelids with warm water, or wash them with warm water that is mixed with little bit of baby shampoo. Do this 2 or more times per day.  Wash your face and eyebrows at least once a day.  Use a clean towel each time you dry your eyelids. Do not use the towel to clean or dry other areas of your body. Do not share your towel with anyone. General instructions   Avoid wearing makeup until you get better. Do not share makeup with anyone.  Avoid rubbing your eyes.  Put a warm compress on your eyes 2 times per day for 10 minutes at a time or as told by your doctor.  If you were told to use an medicated cream or eye drops, use the medicine as told by your doctor. Do not stop using the medicine even if you feel better.  Keep all follow-up visits as told by your doctor. This is important. Contact a doctor if:  Your eyelids feel hot.  You have blisters on your eyelids.  You have a rash on your eyelids.  The swelling does not go away in 2-4 days.  The swelling gets worse. Get help right away if:  You have pain that gets worse.  You have pain that spreads to other parts of your face.  You have redness that gets worse.  You have redness that spreads to other parts of your face.  Your vision changes.  You have pain when you look at lights or things that move.  You have a fever. This information is not intended to replace advice given to you by your health care provider. Make sure you discuss any questions you have with your health care provider. Document Released: 04/03/2008 Document Revised: 12/01/2015 Document Reviewed: 10/18/2014 Elsevier Interactive Patient Education  2017 Elsevier Inc.  

## 2016-09-06 NOTE — Progress Notes (Signed)
   Subjective:  Sabrina Buckley is a 31 y.o. female [facility] with a chief complaint of swollen left eyelid.   HPI:  Swollen Left Eyelid Symptoms started about 4 days ago. Patient wok upw ith crusted eye and noticed that her upper eyelid was swollen. She had some mild pain at the time but that has since resolved. She has been placing a hot rag on the area which has helped some. No fevers. No pain in the eye itself. No vision changes. No known sick contacts.   ROS: Per HPI  PMH: Smoking history reviewed.   Objective:  Physical Exam: BP 112/72   Pulse 85   Temp 97.7 F (36.5 C) (Oral)   Wt 179 lb 6.4 oz (81.4 kg)   SpO2 98%   BMI 31.78 kg/m   Gen: NAD, resting comfortably HEENT: -Left eye: Upper and lower eyelids with significant edema and no erythema. Hordeolum noted on nasal aspect of upper eyelid. Eyelids everted with no abnormalities noted.  - Eyes: EOMI without pain. Visual acuity intact.   Assessment/Plan:  Blepharitis No signs of preseptal or orbital cellulitis. Will treat with topical antibiotics. Continue warm compresses. Strict return precautions reviewed. Follow up in 1 week. If not resolved, may need referral to ophthalmology to evaluate for possible chalazion.   Katina Degreealeb M. Jimmey RalphParker, MD [facility] Resident PGY-3 09/06/2016 3:25 PM

## 2016-09-12 ENCOUNTER — Ambulatory Visit (INDEPENDENT_AMBULATORY_CARE_PROVIDER_SITE_OTHER): Payer: Medicaid Other | Admitting: Obstetrics and Gynecology

## 2016-09-12 ENCOUNTER — Encounter: Payer: Self-pay | Admitting: Obstetrics and Gynecology

## 2016-09-12 VITALS — BP 123/84 | HR 62 | Wt 178.6 lb

## 2016-09-12 DIAGNOSIS — Z3009 Encounter for other general counseling and advice on contraception: Secondary | ICD-10-CM | POA: Diagnosis not present

## 2016-09-12 NOTE — Progress Notes (Signed)
Obstetrics and Gynecology Established Patient Evaluation  Appointment Date: 09/12/2016  [facility]  Primary Care Provider: Jacquiline Doealeb Parker  Chief Complaint: contraception management History of Present Illness: Sabrina Buckley is a 31 y.o. African-American [phone] (No LMP recorded.), seen for the above chief complaint. Her past medical history is significant for h/o umbilical hernia repair with mesh, BMI 32, tobacco abuse, h/o c-section x 1  Patient s/p SVD 07/31/2016 and had ppBTL cancelled due to her mesh history and set up for interval BTL consult with me. BTL papers signed in 06/2016. She hasn't had a period yet or intercourse.   Review of Systems: as noted in the History of Present Illness.  Past Medical History:  Past Medical History:  Diagnosis Date  . Chlamydia   . Vaginal Pap smear, abnormal   . Ventral hernia     Past Surgical History:  Past Surgical History:  Procedure Laterality Date  . CESAREAN SECTION    . HERNIA REPAIR    . UMBILICAL HERNIA REPAIR N/A 2013   with mesh    Past Obstetrical History:  OB History  Gravida Para Term Preterm AB Living  4 3 2 1 1 3   SAB TAB Ectopic Multiple Live Births  1     0 3    # Outcome Date GA Lbr Len/2nd Weight Sex Delivery Anes PTL Lv  4 Term 07/31/16 6224w0d / 00:20 6 lb 12.5 oz (3.075 kg) F VBAC EPI  LIV  3 SAB 10/08/11 6360w0d    SAB  N      Birth Comments: SAB at 10-11 weeks. Did not require instrumentation or medication, was fully spontaneous.  2 Preterm 07/24/06 2138w0d  5 lb 10 oz (2.551 kg) F CS-Unspec  N LIV     Birth Comments: [facility] 35 weeks [facility] "arrythmia" of baby's heartbeat. Child discharged from nursery on propranolol and has grown out of cardiac issue, follows up regularly with cardiologist each year.  1 Term 04/22/02   6 lb 14 oz (3.118 kg) M Vag-Spont  Y LIV     Complications: Cervical abnormality complicating pregnancy in third  trimester     Birth Comments: Required bed rest from 6 mos to delivery due to premature dilation. Delivered full term, no other issues during that pregnancy.      Past Gynecological History: As per HPI. Pap neg 08/2013   Social History:  Social History   Social History  . Marital status: Single    Spouse name: N/A  . Number of children: N/A  . Years of education: N/A   Occupational History  . Not on file.   Social History Main Topics  . Smoking status: Current Every Day Smoker    Packs/day: 0.25    Types: Cigarettes  . Smokeless tobacco: Never Used  . Alcohol use No  . Drug use: No  . Sexual activity: Yes    Birth control/ protection: None     Comment: No condom use   Other Topics Concern  . Not on file   Social History Narrative  . No narrative on file    Family History: No family history on file.   Medications Ms. Jalloh had no medications administered during this visit. Current Outpatient Prescriptions  Medication Sig Dispense Refill  . acetaminophen (TYLENOL) 325 MG tablet Take 2 tablets (650 mg total) by mouth every 6 (six) hours as needed for moderate pain (pain). (Patient not taking: Reported on 09/12/2016)  30 tablet 0  . ferrous sulfate 325 (65 FE) MG tablet Take 1 tablet (325 mg total) by mouth 2 (two) times daily with a meal. (Patient not taking: Reported on 09/12/2016) 60 tablet 3  . ibuprofen (ADVIL,MOTRIN) 600 MG tablet Take 1 tablet (600 mg total) by mouth every 6 (six) hours. (Patient not taking: Reported on 09/12/2016) 30 tablet 0  . Prenatal MV-Min-FA-Omega-3 (PRENATAL GUMMIES/DHA & FA) 0.4-32.5 MG CHEW Chew 1 tablet by mouth daily.    Marland Kitchen senna-docusate (SENOKOT-S) 8.6-50 MG tablet Take 2 tablets by mouth daily. (Patient not taking: Reported on 09/12/2016) 30 tablet 0  . trimethoprim-polymyxin b (POLYTRIM) ophthalmic solution Place 2 drops into both eyes every 6 (six) hours. (Patient not taking: Reported on 09/12/2016) 10 mL 0   No current  facility-administered medications for this visit.     Allergies Patient has no known allergies.   Physical Exam:  BP 123/84   Pulse 62   Wt 178 lb 9.6 oz (81 kg)   Breastfeeding? No   BMI 31.64 kg/m  Body mass index is 31.64 kg/m. General appearance: Well nourished, well developed female in no acute distress.  Cardiovascular: normal s1 and s2.  No murmurs, rubs or gallops. Respiratory:  Clear to auscultation bilateral. Normal respiratory effort Abdomen: positive bowel sounds and no masses, hernias; diffusely non tender to palpation, non distended Neuro/Psych:  Normal mood and affect.  Skin:  Warm and dry.   Laboratory: none  Radiology: none  Assessment: pt doing well  Plan:  1. Encounter for other general counseling or advice on contraception D/w her re: r/b/a with BTL, including no effect on menses. Also d/w her increased surgical risks given her history. She is certain that she'd like to have BTL. Request sent for scheduling this. If able to be done quickly no need for interval BC.   Will approach via [place]  Pap needed at post op visit.    RTC after surgery.   Cornelia Copa MD Attending Center for [employer] [facility])

## 2016-09-13 ENCOUNTER — Encounter (HOSPITAL_COMMUNITY): Payer: Self-pay

## 2016-09-13 ENCOUNTER — Encounter: Payer: Self-pay | Admitting: *Deleted

## 2016-09-17 ENCOUNTER — Ambulatory Visit: Payer: Medicaid Other | Admitting: Family Medicine

## 2016-09-21 ENCOUNTER — Ambulatory Visit (INDEPENDENT_AMBULATORY_CARE_PROVIDER_SITE_OTHER): Payer: Medicaid Other | Admitting: Internal Medicine

## 2016-09-21 ENCOUNTER — Encounter: Payer: Self-pay | Admitting: Internal Medicine

## 2016-09-21 ENCOUNTER — Other Ambulatory Visit (HOSPITAL_COMMUNITY)
Admission: RE | Admit: 2016-09-21 | Discharge: 2016-09-21 | Disposition: A | Discharge status: Home / Self Care | Payer: Medicaid Other | Source: Ambulatory Visit | Attending: Family Medicine | Admitting: Family Medicine

## 2016-09-21 DIAGNOSIS — Z1151 Encounter for screening for human papillomavirus (HPV): Secondary | ICD-10-CM | POA: Insufficient documentation

## 2016-09-21 DIAGNOSIS — N898 Other specified noninflammatory disorders of vagina: Secondary | ICD-10-CM

## 2016-09-21 DIAGNOSIS — Z124 Encounter for screening for malignant neoplasm of cervix: Secondary | ICD-10-CM

## 2016-09-21 DIAGNOSIS — Z01419 Encounter for gynecological examination (general) (routine) without abnormal findings: Secondary | ICD-10-CM | POA: Insufficient documentation

## 2016-09-21 DIAGNOSIS — Z113 Encounter for screening for infections with a predominantly sexual mode of transmission: Secondary | ICD-10-CM | POA: Insufficient documentation

## 2016-09-21 MED ORDER — NORETHINDRONE 0.35 MG PO TABS: 1.0000 | ORAL_TABLET | Freq: Every day | ORAL | 5 refills | Status: DC

## 2016-09-21 NOTE — Patient Instructions (Signed)
Thank you for coming in  I sent Micronor to the pharmacy. Take everyday at the same time. Remember to use a second method of contraception for at least 7 days after starting this medicine.  We did you pap today

## 2016-09-21 NOTE — Progress Notes (Signed)
   [facility] Phone: [phone]   Date of Visit: 09/21/2016   HPI:  Postpartum visit: patient is 7 weeks and 2 days postpartum following a spontaneous vaginal delivery. -Breastfeeding: formula. No breast pain  -Contraception: desires BTL. Has surgery scheduled for April 10th.  -Bleeding: no -Bonding with infant: good.  -Sexual activity: sexually active now. Is using condoms. Planning for BTL. Would like some other type of contraception until her BTL  -Mood: EPDS score 3, no SI - is due for pap smear today. Denies abnormal vaginal discharge. She has not started her period yet. She requests screening for Gc/chlamydia    ROS: See HPI.  PMFSH:  Tobacco Dependence Obesity  GERD  PHYSICAL EXAM: BP (!) 102/58   Pulse 85   Temp 98.4 F (36.9 C) (Oral)   Ht 5\' 3"  (1.6 m)   Wt 182 lb (82.6 kg)   SpO2 98%   BMI 32.24 kg/m  GEN: NAD CV: RRR, no murmurs, rubs, or gallops PULM: CTAB, normal effort ABD: Soft, nontender, nondistended, NABS, no organomegaly Female genitalia: normal external genitalia, vulva, vagina, cervix, uterus and adnexa, no cervical motion tenderness SKIN: No rash or cyanosis; warm and well-perfused EXTR: No lower extremity edema or calf tenderness PSYCH: Mood and affect euthymic, normal rate and volume of speech NEURO: Awake, alert, no focal deficits grossly, normal speech   ASSESSMENT/PLAN:  Health maintenance:  - pap smear today   Encounter for postpartum visit: Doing well. EPDS score normal. Would like oral contraception until she has her surgery for BTL. Due to obesity and tobacco use, ordered Micronor. Recommended continuing condom use at least for the first 7 days when starting medication and discussed the importance of taking medication daily at the same time.   FOLLOW UP: Follow up PRN  Palma HolterKanishka G Jaysen Wey, MD PGY 2  Family Medicine

## 2016-09-24 LAB — CERVICOVAGINAL ANCILLARY ONLY
CHLAMYDIA, DNA PROBE: NEGATIVE
NEISSERIA GONORRHEA: NEGATIVE

## 2016-09-26 ENCOUNTER — Telehealth: Payer: Self-pay | Admitting: Internal Medicine

## 2016-09-26 NOTE — Telephone Encounter (Signed)
Please call and inform patient of normal pap smear and negative Gc/Chlamydia. Thank you!

## 2016-09-27 LAB — CYTOLOGY - PAP
DIAGNOSIS: NEGATIVE
HPV (WINDOPATH): NOT DETECTED

## 2016-09-27 NOTE — Telephone Encounter (Signed)
Reported both to patient.

## 2016-10-01 ENCOUNTER — Other Ambulatory Visit: Payer: Self-pay | Admitting: Obstetrics and Gynecology

## 2016-10-01 NOTE — Patient Instructions (Signed)
Your procedure is scheduled on:  Tuesday, October 16, 2016  Enter through the [facility]tr[facility] at:  6:00 AM  Pick up the phone at the desk and dial [phone].  Call this number if you have problems the morning of surgery: [phone].  Remember: Do NOT eat food or drink after:  Midnight Monday  Take these medicines the morning of surgery with a SIP OF WATER:  None  Stop ALL herbal medications at this time  Do NOT smoke the day of surgery.  Do NOT wear jewelry (body piercing), metal hair clips/bobby pins, make-up, or nail polish. Do NOT wear lotions, powders, or perfumes.  You may wear deodorant. Do NOT shave for 48 hours prior to surgery. Do NOT bring valuables to the hospital. Contacts, dentures, or bridgework may not be worn into surgery.  Have a responsible adult drive you home and stay with you for 24 hours after your procedure  Bring a copy of your healthcare power of attorney and living will documents.

## 2016-10-02 ENCOUNTER — Inpatient Hospital Stay (HOSPITAL_COMMUNITY)
Admission: RE | Admit: 2016-10-02 | Discharge: 2016-10-02 | Disposition: A | Discharge status: Home / Self Care | Payer: Medicaid Other | Source: Ambulatory Visit

## 2016-10-10 NOTE — Patient Instructions (Addendum)
Your procedure is scheduled on:  Tuesday, 4/10  Enter through the Main Entrance of [facility] at: 6 am  Pick up the phone at the desk and dial [phone].  Call this number if you have problems the morning of surgery: [phone].  Remember: Do NOT eat or drink (including water) after midnight Monday, 4/9  Take these medicines the morning of surgery with a SIP OF WATER:  None  Do NOT wear jewelry (body piercing), metal hair clips/bobby pins, make-up, or nail polish. Do NOT wear lotions, powders, or perfumes.  You may wear deoderant. Do NOT shave for 48 hours prior to surgery. Do NOT bring valuables to the hospital.   Have a responsible adult drive you home and stay with you for 24 hours after your procedure. Home with boyfriend Mellody Dance cell [phone].

## 2016-10-11 ENCOUNTER — Encounter (HOSPITAL_COMMUNITY)
Admission: RE | Admit: 2016-10-11 | Discharge: 2016-10-11 | Disposition: A | Discharge status: Home / Self Care | Payer: Medicaid Other | Source: Ambulatory Visit | Attending: Obstetrics and Gynecology | Admitting: Obstetrics and Gynecology

## 2016-10-11 ENCOUNTER — Encounter (INDEPENDENT_AMBULATORY_CARE_PROVIDER_SITE_OTHER): Payer: Self-pay

## 2016-10-11 ENCOUNTER — Encounter (HOSPITAL_COMMUNITY): Payer: Self-pay

## 2016-10-11 DIAGNOSIS — Z01812 Encounter for preprocedural laboratory examination: Secondary | ICD-10-CM | POA: Insufficient documentation

## 2016-10-11 DIAGNOSIS — Z302 Encounter for sterilization: Secondary | ICD-10-CM | POA: Insufficient documentation

## 2016-10-11 HISTORY — DX: Anemia, unspecified: D64.9

## 2016-10-11 LAB — CBC
HEMATOCRIT: 38.3 % (ref 36.0–46.0)
HEMOGLOBIN: 12.3 g/dL (ref 12.0–15.0)
MCH: 27.9 pg (ref 26.0–34.0)
MCHC: 32.1 g/dL (ref 30.0–36.0)
MCV: 86.8 fL (ref 78.0–100.0)
Platelets: 264 10*3/uL (ref 150–400)
RBC: 4.41 MIL/uL (ref 3.87–5.11)
RDW: 15.4 % (ref 11.5–15.5)
WBC: 5.6 10*3/uL (ref 4.0–10.5)

## 2016-10-11 LAB — TYPE AND SCREEN
ABO/RH(D): O POS
ANTIBODY SCREEN: NEGATIVE

## 2016-10-15 ENCOUNTER — Encounter (HOSPITAL_COMMUNITY): Payer: Self-pay | Admitting: Anesthesiology

## 2016-10-15 NOTE — Anesthesia Preprocedure Evaluation (Addendum)
Anesthesia Evaluation  Patient identified by MRN, date of birth, ID band Patient awake    Reviewed: Allergy & Precautions, NPO status , Patient's Chart, lab work & pertinent test results  Airway Mallampati: I       Dental no notable dental hx.    Pulmonary Current Smoker,    Pulmonary exam normal        Cardiovascular Normal cardiovascular exam Rhythm:Regular Rate:Normal     Neuro/Psych negative neurological ROS     GI/Hepatic Neg liver ROS,   Endo/Other  negative endocrine ROS  Renal/GU negative Renal ROS  negative genitourinary   Musculoskeletal negative musculoskeletal ROS (+)   Abdominal (+) + obese,   Peds  Hematology   Anesthesia Other Findings   Reproductive/Obstetrics negative OB ROS                            Anesthesia Physical Anesthesia Plan  ASA: II  Anesthesia Plan: General   Post-op Pain Management:    Induction: Intravenous  Airway Management Planned: Oral ETT  Additional Equipment:   Intra-op Plan:   Post-operative Plan: Extubation in OR  Informed Consent: I have reviewed the patients History and Physical, chart, labs and discussed the procedure including the risks, benefits and alternatives for the proposed anesthesia with the patient or authorized representative who has indicated his/her understanding and acceptance.     Plan Discussed with:   Anesthesia Plan Comments:        Anesthesia Quick Evaluation

## 2016-10-16 ENCOUNTER — Encounter (HOSPITAL_COMMUNITY)
Admission: RE | Disposition: A | Discharge status: Home / Self Care | Payer: Self-pay | Source: Ambulatory Visit | Attending: Obstetrics and Gynecology

## 2016-10-16 ENCOUNTER — Encounter (HOSPITAL_COMMUNITY): Payer: Self-pay | Admitting: *Deleted

## 2016-10-16 ENCOUNTER — Ambulatory Visit (HOSPITAL_COMMUNITY): Payer: Medicaid Other | Admitting: Anesthesiology

## 2016-10-16 ENCOUNTER — Encounter: Payer: Self-pay | Admitting: Obstetrics and Gynecology

## 2016-10-16 ENCOUNTER — Ambulatory Visit (HOSPITAL_COMMUNITY)
Admission: RE | Admit: 2016-10-16 | Discharge: 2016-10-16 | Disposition: A | Discharge status: Home / Self Care | Payer: Medicaid Other | Source: Ambulatory Visit | Attending: Obstetrics and Gynecology | Admitting: Obstetrics and Gynecology

## 2016-10-16 ENCOUNTER — Encounter (HOSPITAL_COMMUNITY): Payer: Self-pay

## 2016-10-16 DIAGNOSIS — E669 Obesity, unspecified: Secondary | ICD-10-CM | POA: Insufficient documentation

## 2016-10-16 DIAGNOSIS — F17210 Nicotine dependence, cigarettes, uncomplicated: Secondary | ICD-10-CM | POA: Insufficient documentation

## 2016-10-16 DIAGNOSIS — N736 Female pelvic peritoneal adhesions (postinfective): Secondary | ICD-10-CM | POA: Diagnosis not present

## 2016-10-16 DIAGNOSIS — Z302 Encounter for sterilization: Secondary | ICD-10-CM | POA: Insufficient documentation

## 2016-10-16 DIAGNOSIS — Z79899 Other long term (current) drug therapy: Secondary | ICD-10-CM | POA: Diagnosis not present

## 2016-10-16 DIAGNOSIS — Z6832 Body mass index (BMI) 32.0-32.9, adult: Secondary | ICD-10-CM | POA: Diagnosis not present

## 2016-10-16 DIAGNOSIS — D649 Anemia, unspecified: Secondary | ICD-10-CM | POA: Insufficient documentation

## 2016-10-16 DIAGNOSIS — Z9889 Other specified postprocedural states: Secondary | ICD-10-CM

## 2016-10-16 DIAGNOSIS — Z309 Encounter for contraceptive management, unspecified: Secondary | ICD-10-CM | POA: Diagnosis present

## 2016-10-16 HISTORY — DX: Peritoneal adhesions (postprocedural) (postinfection): K66.0

## 2016-10-16 HISTORY — DX: Personal history of other diseases of the digestive system: Z87.19

## 2016-10-16 HISTORY — PX: LAPAROSCOPY: SHX197

## 2016-10-16 HISTORY — PX: LAPAROSCOPIC TUBAL LIGATION: SHX1937

## 2016-10-16 LAB — TYPE AND SCREEN
ABO/RH(D): O POS
Antibody Screen: NEGATIVE

## 2016-10-16 LAB — PREGNANCY, URINE: Preg Test, Ur: NEGATIVE

## 2016-10-16 SURGERY — LIGATION, FALLOPIAN TUBE, LAPAROSCOPIC
Anesthesia: General | Laterality: Bilateral

## 2016-10-16 MED ORDER — GLYCOPYRROLATE 0.2 MG/ML IJ SOLN
INTRAMUSCULAR | Status: AC
  Filled 2016-10-16: qty 1

## 2016-10-16 MED ORDER — SUGAMMADEX SODIUM 200 MG/2ML IV SOLN
INTRAVENOUS | Status: DC | PRN
  Administered 2016-10-16: 200 mg via INTRAVENOUS

## 2016-10-16 MED ORDER — FENTANYL CITRATE (PF) 100 MCG/2ML IJ SOLN
INTRAMUSCULAR | Status: AC
  Administered 2016-10-16: 25 ug via INTRAVENOUS
  Filled 2016-10-16: qty 2

## 2016-10-16 MED ORDER — ACETAMINOPHEN 325 MG PO TABS: 325.0000 mg | ORAL_TABLET | ORAL | Status: DC | PRN

## 2016-10-16 MED ORDER — KETOROLAC TROMETHAMINE 30 MG/ML IJ SOLN
INTRAMUSCULAR | Status: DC | PRN
  Administered 2016-10-16: 30 mg via INTRAVENOUS

## 2016-10-16 MED ORDER — LIDOCAINE HCL (CARDIAC) 20 MG/ML IV SOLN
INTRAVENOUS | Status: DC | PRN
  Administered 2016-10-16: 50 mg via INTRAVENOUS

## 2016-10-16 MED ORDER — GLYCOPYRROLATE 0.2 MG/ML IJ SOLN
INTRAMUSCULAR | Status: DC | PRN
  Administered 2016-10-16: 0.2 mg via INTRAVENOUS

## 2016-10-16 MED ORDER — LIDOCAINE HCL (CARDIAC) 20 MG/ML IV SOLN
INTRAVENOUS | Status: AC
  Filled 2016-10-16: qty 5

## 2016-10-16 MED ORDER — OXYCODONE-ACETAMINOPHEN 5-325 MG PO TABS: 1.0000 | ORAL_TABLET | Freq: Four times a day (QID) | ORAL | 0 refills | Status: DC | PRN

## 2016-10-16 MED ORDER — MIDAZOLAM HCL 2 MG/2ML IJ SOLN
INTRAMUSCULAR | Status: AC
  Filled 2016-10-16: qty 2

## 2016-10-16 MED ORDER — ONDANSETRON HCL 4 MG/2ML IJ SOLN
INTRAMUSCULAR | Status: AC
Start: 2016-10-16 — End: ?
  Filled 2016-10-16: qty 2

## 2016-10-16 MED ORDER — FENTANYL CITRATE (PF) 100 MCG/2ML IJ SOLN
INTRAMUSCULAR | Status: DC | PRN
  Administered 2016-10-16: 100 ug via INTRAVENOUS
  Administered 2016-10-16: 50 ug via INTRAVENOUS
  Administered 2016-10-16 (×2): 100 ug via INTRAVENOUS

## 2016-10-16 MED ORDER — SODIUM CHLORIDE 0.9 % IR SOLN
Status: DC | PRN
  Administered 2016-10-16: 1000 mL

## 2016-10-16 MED ORDER — SUGAMMADEX SODIUM 200 MG/2ML IV SOLN
INTRAVENOUS | Status: AC
  Filled 2016-10-16: qty 2

## 2016-10-16 MED ORDER — ONDANSETRON HCL 4 MG/2ML IJ SOLN
INTRAMUSCULAR | Status: DC | PRN
  Administered 2016-10-16: 4 mg via INTRAVENOUS

## 2016-10-16 MED ORDER — ROCURONIUM BROMIDE 100 MG/10ML IV SOLN
INTRAVENOUS | Status: AC
  Filled 2016-10-16: qty 1

## 2016-10-16 MED ORDER — FENTANYL CITRATE (PF) 250 MCG/5ML IJ SOLN
INTRAMUSCULAR | Status: AC
  Filled 2016-10-16: qty 5

## 2016-10-16 MED ORDER — ROCURONIUM BROMIDE 100 MG/10ML IV SOLN
INTRAVENOUS | Status: DC | PRN
  Administered 2016-10-16: 10 mg via INTRAVENOUS
  Administered 2016-10-16: 30 mg via INTRAVENOUS

## 2016-10-16 MED ORDER — OXYCODONE HCL 5 MG PO TABS: 5.0000 mg | ORAL_TABLET | Freq: Once | ORAL | Status: DC | PRN

## 2016-10-16 MED ORDER — DEXAMETHASONE SODIUM PHOSPHATE 4 MG/ML IJ SOLN
INTRAMUSCULAR | Status: AC
  Filled 2016-10-16: qty 1

## 2016-10-16 MED ORDER — ACETAMINOPHEN 160 MG/5ML PO SOLN: 325.0000 mg | ORAL | Status: DC | PRN

## 2016-10-16 MED ORDER — MIDAZOLAM HCL 2 MG/2ML IJ SOLN
INTRAMUSCULAR | Status: DC | PRN
  Administered 2016-10-16: 2 mg via INTRAVENOUS

## 2016-10-16 MED ORDER — BUPIVACAINE HCL 0.5 % IJ SOLN
INTRAMUSCULAR | Status: DC | PRN
  Administered 2016-10-16: 8 mL

## 2016-10-16 MED ORDER — FENTANYL CITRATE (PF) 100 MCG/2ML IJ SOLN
INTRAMUSCULAR | Status: AC
  Filled 2016-10-16: qty 2

## 2016-10-16 MED ORDER — KETOROLAC TROMETHAMINE 30 MG/ML IJ SOLN: 30.0000 mg | Freq: Once | INTRAMUSCULAR | Status: DC | PRN

## 2016-10-16 MED ORDER — SOD CITRATE-CITRIC ACID 500-334 MG/5ML PO SOLN
30.0000 mL | ORAL | Status: AC
  Administered 2016-10-16: 30 mL via ORAL

## 2016-10-16 MED ORDER — SODIUM CHLORIDE 0.9 % IV SOLN: INTRAVENOUS | Status: DC

## 2016-10-16 MED ORDER — SCOPOLAMINE 1 MG/3DAYS TD PT72
MEDICATED_PATCH | TRANSDERMAL | Status: AC
  Administered 2016-10-16: 1.5 mg via TRANSDERMAL
  Filled 2016-10-16: qty 1

## 2016-10-16 MED ORDER — PROPOFOL 10 MG/ML IV BOLUS
INTRAVENOUS | Status: AC
  Filled 2016-10-16: qty 20

## 2016-10-16 MED ORDER — OXYCODONE HCL 5 MG/5ML PO SOLN: 5.0000 mg | Freq: Once | ORAL | Status: DC | PRN

## 2016-10-16 MED ORDER — SCOPOLAMINE 1 MG/3DAYS TD PT72
1.0000 | MEDICATED_PATCH | Freq: Once | TRANSDERMAL | Status: DC
  Administered 2016-10-16: 1.5 mg via TRANSDERMAL

## 2016-10-16 MED ORDER — FENTANYL CITRATE (PF) 100 MCG/2ML IJ SOLN
25.0000 ug | INTRAMUSCULAR | Status: DC | PRN
  Administered 2016-10-16 (×3): 25 ug via INTRAVENOUS

## 2016-10-16 MED ORDER — LACTATED RINGERS IV SOLN
INTRAVENOUS | Status: DC
  Administered 2016-10-16 (×2): via INTRAVENOUS

## 2016-10-16 MED ORDER — KETOROLAC TROMETHAMINE 30 MG/ML IJ SOLN
INTRAMUSCULAR | Status: AC
  Filled 2016-10-16: qty 1

## 2016-10-16 MED ORDER — ONDANSETRON HCL 4 MG/2ML IJ SOLN: 4.0000 mg | Freq: Once | INTRAMUSCULAR | Status: DC | PRN

## 2016-10-16 MED ORDER — SOD CITRATE-CITRIC ACID 500-334 MG/5ML PO SOLN
ORAL | Status: AC
  Filled 2016-10-16: qty 15

## 2016-10-16 MED ORDER — BUPIVACAINE HCL (PF) 0.5 % IJ SOLN
INTRAMUSCULAR | Status: AC
  Filled 2016-10-16: qty 30

## 2016-10-16 MED ORDER — MEPERIDINE HCL 25 MG/ML IJ SOLN: 6.2500 mg | INTRAMUSCULAR | Status: DC | PRN

## 2016-10-16 MED ORDER — DEXAMETHASONE SODIUM PHOSPHATE 10 MG/ML IJ SOLN
INTRAMUSCULAR | Status: DC | PRN
  Administered 2016-10-16: 4 mg via INTRAVENOUS

## 2016-10-16 MED ORDER — PROPOFOL 10 MG/ML IV BOLUS
INTRAVENOUS | Status: DC | PRN
  Administered 2016-10-16: 150 mg via INTRAVENOUS

## 2016-10-16 SURGICAL SUPPLY — 34 items
ADH SKN CLS APL DERMABOND .7 (GAUZE/BANDAGES/DRESSINGS) ×2
ADH SKN CLS LQ APL DERMABOND (GAUZE/BANDAGES/DRESSINGS) ×2
BLADE SURG 15 STRL LF C SS BP (BLADE) ×4 IMPLANT
BLADE SURG 15 STRL SS (BLADE) ×6
CLIP FILSHIE TUBAL LIGA STRL (Clip) ×4 IMPLANT
CLOTH BEACON ORANGE TIMEOUT ST (SAFETY) ×3 IMPLANT
DERMABOND ADHESIVE PROPEN (GAUZE/BANDAGES/DRESSINGS) ×1
DERMABOND ADVANCED (GAUZE/BANDAGES/DRESSINGS) ×1
DERMABOND ADVANCED .7 DNX12 (GAUZE/BANDAGES/DRESSINGS) ×2 IMPLANT
DERMABOND ADVANCED .7 DNX6 (GAUZE/BANDAGES/DRESSINGS) IMPLANT
DRSG OPSITE POSTOP 3X4 (GAUZE/BANDAGES/DRESSINGS) ×3 IMPLANT
DURAPREP 26ML APPLICATOR (WOUND CARE) ×3 IMPLANT
GLOVE BIO SURGEON STRL SZ7 (GLOVE) ×3 IMPLANT
GLOVE BIOGEL PI IND STRL 7.5 (GLOVE) ×2 IMPLANT
GLOVE BIOGEL PI INDICATOR 7.5 (GLOVE) ×1
GOWN STRL REUS W/TWL LRG LVL3 (GOWN DISPOSABLE) ×6 IMPLANT
NDL INSUFF ACCESS 14 VERSASTEP (NEEDLE) ×1 IMPLANT
NEEDLE INSUFFLATION 120MM (ENDOMECHANICALS) ×3 IMPLANT
PACK LAPAROSCOPY BASIN (CUSTOM PROCEDURE TRAY) ×3 IMPLANT
PACK TRENDGUARD 450 HYBRID PRO (MISCELLANEOUS) IMPLANT
PACK TRENDGUARD 600 HYBRD PROC (MISCELLANEOUS) IMPLANT
PAD OB MATERNITY 4.3X12.25 (PERSONAL CARE ITEMS) ×3 IMPLANT
PROTECTOR NERVE ULNAR (MISCELLANEOUS) ×6 IMPLANT
SUT MNCRL AB 4-0 PS2 18 (SUTURE) ×3 IMPLANT
SUT VIC AB 4-0 PS2 18 (SUTURE) ×3 IMPLANT
SUT VICRYL 0 UR6 27IN ABS (SUTURE) ×9 IMPLANT
TOWEL OR 17X24 6PK STRL BLUE (TOWEL DISPOSABLE) ×3 IMPLANT
TRAY FOLEY CATH SILVER 14FR (SET/KITS/TRAYS/PACK) ×3 IMPLANT
TRENDGUARD 450 HYBRID PRO PACK (MISCELLANEOUS) ×3
TRENDGUARD 600 HYBRID PROC PK (MISCELLANEOUS)
TROCAR BALLN 12MMX100 BLUNT (TROCAR) IMPLANT
TROCAR VERSASTEP PLUS 5MM (TROCAR) ×1 IMPLANT
TROCAR XCEL NON-BLD 11X100MML (ENDOMECHANICALS) ×4 IMPLANT
WARMER LAPAROSCOPE (MISCELLANEOUS) ×3 IMPLANT

## 2016-10-16 NOTE — H&P (Signed)
Pre Operative H&P  Date: 10/16/2016  OBGYN Clinic: [facility]  Primary Care Provider: Jacquiline Doe  Chief Complaint: H&P  History of Present Illness: Sabrina Buckley is a 31 y.o. African-American [phone] (No LMP recorded.), seen for the above chief complaint. Her past medical history is significant for h/o umbilical hernia repair with mesh, BMI 32, tobacco abuse, h/o c-section x 1  Patient s/p SVD 07/31/2016 and had ppBTL cancelled due to her mesh history and set up for interval BTL consult with me. BTL papers signed in 06/2016. She hasn't had a period yet or intercourse.   Review of Systems: as noted in the History of Present Illness.  Past Medical History:      Past Medical History:  Diagnosis Date  . Chlamydia   . Vaginal Pap smear, abnormal   . Ventral hernia     Past Surgical History:       Past Surgical History:  Procedure Laterality Date  . CESAREAN SECTION    . HERNIA REPAIR    . UMBILICAL HERNIA REPAIR N/A 2013   with mesh    Past Obstetrical History:                  OB History  Gravida Para Term Preterm AB Living  SAB TAB Ectopic Multiple Live Births  1     0 3    # Outcome Date GA Lbr Len/2nd Weight Sex Delivery Anes PTL Lv  4 Term 07/31/16 [redacted]w[redacted]d / 00:20 6 lb 12.5 oz (3.075 kg) F VBAC EPI  LIV  3 SAB 10/08/11 [redacted]w[redacted]d    SAB  N      Birth Comments: SAB at 10-11 weeks. Did not require instrumentation or medication, was fully spontaneous.  2 Preterm 07/24/06 [redacted]w[redacted]d  5 lb 10 oz (2.551 kg) F CS-Unspec  N LIV     Birth Comments: Delivered at 35 weeks via cesarean section at [facility] due to "arrythmia" of baby's heartbeat. Child discharged from nursery on propranolol and has grown out of cardiac issue, follows up regularly with cardiologist each year.  1 Term 04/22/02   6 lb 14 oz (3.118 kg) M Vag-Spont  Y LIV     Complications: Cervical abnormality complicating pregnancy in third trimester     Birth  Comments: Required bed rest from 6 mos to delivery due to premature dilation. Delivered full term, no other issues during that pregnancy.      Past Gynecological History: As per HPI. Pap neg 08/2013   Social History:  Social History        Social History  . Marital status: Single    Spouse name: N/A  . Number of children: N/A  . Years of education: N/A      Occupational History  . Not on file.         Social History Main Topics  . Smoking status: Current Every Day Smoker    Packs/day: 0.25    Types: Cigarettes  . Smokeless tobacco: Never Used  . Alcohol use No  . Drug use: No  . Sexual activity: Yes    Birth control/ protection: None     Comment: No condom use       Other Topics Concern  . Not on file      Social History Narrative  . No narrative on file    Family History: No family history on file.   Medications Ms. Martis had no medications administered  during this visit.       Current Outpatient Prescriptions  Medication Sig Dispense Refill  . acetaminophen (TYLENOL) 325 MG tablet Take 2 tablets (650 mg total) by mouth every 6 (six) hours as needed for moderate pain (pain). (Patient not taking: Reported on 09/12/2016) 30 tablet 0  . ferrous sulfate 325 (65 FE) MG tablet Take 1 tablet (325 mg total) by mouth 2 (two) times daily with a meal. (Patient not taking: Reported on 09/12/2016) 60 tablet 3  . ibuprofen (ADVIL,MOTRIN) 600 MG tablet Take 1 tablet (600 mg total) by mouth every 6 (six) hours. (Patient not taking: Reported on 09/12/2016) 30 tablet 0  . Prenatal MV-Min-FA-Omega-3 (PRENATAL GUMMIES/DHA & FA) 0.4-32.5 MG CHEW Chew 1 tablet by mouth daily.    Marland Kitchen senna-docusate (SENOKOT-S) 8.6-50 MG tablet Take 2 tablets by mouth daily. (Patient not taking: Reported on 09/12/2016) 30 tablet 0  . trimethoprim-polymyxin b (POLYTRIM) ophthalmic solution Place 2 drops into both eyes every 6 (six) hours. (Patient not taking: Reported on 09/12/2016) 10  mL 0   No current facility-administered medications for this visit.     Allergies Patient has no known allergies.   Physical Exam:    Current Vital Signs 24h Vital Sign Ranges  T 98.4 F (36.9 C) Temp  Avg: 98.4 F (36.9 C)  Min: 98.4 F (36.9 C)  Max: 98.4 F (36.9 C)  BP 109/82 BP  Min: 109/82  Max: 109/82  HR 61 Pulse  Avg: 61  Min: 61  Max: 61  RR 18 Resp  Avg: 18  Min: 18  Max: 18  SaO2 100 % Not Delivered SpO2  Avg: 100 %  Min: 100 %  Max: 100 %       24 Hour I/O Current Shift I/O  Time Ins Outs No intake/output data recorded. No intake/output data recorded.   General appearance: Well nourished, well developed female in no acute distress.  Cardiovascular: normal s1 and s2.  No murmurs, rubs or gallops. Respiratory:  Clear to auscultation bilateral. Normal respiratory effort Abdomen: positive bowel sounds and no masses, hernias; diffusely non tender to palpation, non distended Neuro/Psych:  Normal mood and affect.  Skin:  Warm and dry.   Laboratory: O POS  UPT negative CBC Latest Ref Rng & Units 10/11/2016 07/31/2016 05/18/2016  WBC 4.0 - 10.5 K/uL 5.6 6.8 6.5  Hemoglobin 12.0 - 15.0 g/dL 02.7 10.6(L) 9.3(L)  Hematocrit 36.0 - 46.0 % 38.3 32.1(L) 28.6(L)  Platelets 150 - 400 K/uL 264 239 246    Radiology: none  Assessment: pt doing well  Plan:  D/w pt again re: BTL and still desires. Patient consented and can proceed to OR when they are ready.  Will approach via [place]  RTC after surgery.   Cornelia Copa MD Attending Center for [facility])     Electronically signed by Emmet Bing, MD at 09/13/2016 10:09 AM

## 2016-10-16 NOTE — Anesthesia Postprocedure Evaluation (Addendum)
Anesthesia Post Note  Patient: Sabrina Buckley  Procedure(s) Performed: Procedure(s) (LRB): LAPAROSCOPIC TUBAL LIGATION (Bilateral) LAPAROSCOPY DIAGNOSTIC  Patient location during evaluation: PACU Anesthesia Type: General Level of consciousness: awake Pain management: pain level controlled Vital Signs Assessment: post-procedure vital signs reviewed and stable Respiratory status: spontaneous breathing Cardiovascular status: stable Postop Assessment: no signs of nausea or vomiting Anesthetic complications: no        Last Vitals:  Vitals:   10/16/16 0945 10/16/16 1000  BP: (!) 153/107 (!) 154/104  Pulse: 73 70  Resp: 18 12  Temp:      Last Pain:  Vitals:   10/16/16 0945  TempSrc:   PainSc: 5    Pain Goal: Patients Stated Pain Goal: 3 (10/16/16 1610)               Roseland Braun JR,JOHN Susann Givens

## 2016-10-16 NOTE — Op Note (Addendum)
Operative Note   10/16/2016  PRE-OP DIAGNOSIS: Desire for permanent sterilization   POST-OP DIAGNOSIS: Same. Mild abdominal adhesive disease   SURGEON: Surgeon(s) and Role:    * [place] Bing, MD - Primary  ASSISTANT: None  ANESTHESIA: General and local   PROCEDURE: Diagnostic laparoscopy, bilateral tubal ligation via Filshie clips  ESTIMATED BLOOD LOSS: 10mL  DRAINS: indwelling foley UOP   TOTAL IV FLUIDS: crystalloid  SPECIMENS: none   VTE PROPHYLAXIS: SCDs to the bilateral lower extremities  ANTIBIOTICS: not indicated  COMPLICATIONS: none  DISPOSITION: PACU - hemodynamically stable.  CONDITION: stable  FINDINGS: Exam under anesthesia revealed an anteverted uterus approximately 8 week size, normal shape, and no adnexal masses. Laparoscopic survey of the abdomen revealed a grossly normal uterus, tubes, ovaries, liver and gallbladder edge. 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 At the umbilicus extending to the ligament of Treitz, there were filmy omental adhesions seen. No adhesions seen in the pelvis and appears to be a TVH candidate in the future. The patient sounded to 10.5cm.   PROCEDURE IN DETAIL: The patient was taken to the OR where anesthesia was administered and an NG or OG tube placed. The patient was positioned in dorsal lithotomy in the Renwick stirrups. The patient was then examined under anesthesia with the above noted findings. The patient was prepped and draped in the normal sterile fashion and foley catheter was placed. A Graves speculum was placed in the vagina and the anterior lip of the cervix was grasped with a single toothed tenaculum.  A Hulka uterine manipulator was then inserted in the uterus and uterine mobility was found to be satisfactory; the speculum and tenaculum were then removed.  After changing gloves, attention was turned to the patient's abdomen where a 5 mm skin incision was made in the LUQ, after  injection of local anesthesia. The Veress step needle was carefully introduced into the peritoneal cavity at a 45 degree angle while tenting up the anterior abdominal wall. Intraperitoneal placement was confirmed by the use aspiration via syringe, drop test with a water-filled syringe, and with the opening pressure being . Pneumoperitoneum was obtained. The 5mm port was then placed through the sleeve and the operative laparoscope was introduced into the abdomen, the entry site inspected and the patient placed in Trendelenburg, with the above noted findings.Next, a 10mm port was placed in the LLQ, under direct visualization;  a blunt probe was inserted and the bilateral tubes were traced out to their respective fimbriae, with the above noted findings. Next, a Filshie clip was loaded and the placed across the tube approximately one third of the way down the tube from the uterus. The placement was inspected and complete occlusion of the tube, with the bottom jaw on the outside of the mesosalpinx noted. This was then done on the right tube  The LUQ port was removed after placement of a blunt probe through the trocar and the air was released from the LLQ port and then port removed after placement of a blunt probed. The skin was closed with 4-0 monocryl at the LLQ.  Dermabond was placed on both skin incision.  The Hulka was removed with no bleeding noted from the cervix and all other instrumentation was removed from the vagina.  The foley catheter was removed. The patient tolerated the procedure well. All counts were correct x 2. The patient was transferred to the recovery room awake, alert and breathing independently.   Cornelia Copa MD  Attending Center for [facility])

## 2016-10-16 NOTE — Transfer of Care (Signed)
Immediate Anesthesia Transfer of Care Note  Patient: Sabrina Buckley  Procedure(s) Performed: Procedure(s): LAPAROSCOPIC TUBAL LIGATION (Bilateral) LAPAROSCOPY DIAGNOSTIC  Patient Location: PACU  Anesthesia Type:General  Level of Consciousness: awake, alert  and oriented  Airway & Oxygen Therapy: Patient Spontanous Breathing and Patient connected to nasal cannula oxygen  Post-op Assessment: Report given to RN and Post -op Vital signs reviewed and stable  Post vital signs: Reviewed and stable  Last Vitals:  Vitals:   10/16/16 0633  BP: 109/82  Pulse: 61  Resp: 18  Temp: 36.9 C    Last Pain:  Vitals:   10/16/16 0633  TempSrc: Oral      Patients Stated Pain Goal: 3 (10/16/16 1610)  Complications: No apparent anesthesia complications

## 2016-10-16 NOTE — Discharge Instructions (Addendum)
Laparoscopic Surgery Discharge Instructions  Instructions Following Laparoscopic Surgery You have just undergone a laparoscopic surgery.  The following list should answer your most common questions.  Although we will discuss your surgery and post-operative instructions with you prior to your discharge, this list will serve as a reminder if you fail to recall the details of what we discussed.  We will discuss your surgery once again in detail at your post-op visit in two to four weeks. If you havent already done so, please call to make your appointment as soon as possible.  How you will feel: Although you have just undergone a major surgery, your recovery will be significantly shorter since the surgery was performed through much smaller incisions than the traditional approach.  You should feel slightly better each day.  If you suddenly feel much worse than the prior day, please call the clinic.  Its important during the early part of your recovery that you maintain some activity.  Walking is encouraged.  You will quicken your recovery by continued activity.  Incision:  Your incisions will be closed with dissolvable stitches or surgical adhesive (glue).  There may be Band-aids and/or Steri-strips covering your incisions.  If there is no drainage from the incisions you may remove the Band-aids in one to two days.  You may notice some minor bruising at the incision sites.  This is common and will resolve within several days.  Please inform us if the redness at the edges of your incision appears to be spreading.  If the skin around your incision becomes warm to the touch, or if you notice a pus-like drainage, please call the office.  Vaginal Discharge Following a Laparoscopic Hysterectomy: Minor vaginal bleeding or spotting is normal following a hysterectomy.  Bleeding similar to the amount of your period is excessive, and you should inform us of this immediately.  Vaginal spotting may continue for several  weeks following your surgery.  You may notice a yellowish discharge which occasionally occurs as the vaginal stitches dissolve, and may last for several weeks.  Sexual Activity Following a Hysterectomy: Do not have sexual intercourse or place tampons or douches in the vagina prior to your first office visit.  We will discuss when you may resume these activities at that visit.    Stairs/Driving/Activities: You may climb stairs if necessary.  If youve had general anesthesia, do not drive a car the rest of the day today.  You may begin light housework when you feel up to it, but avoid heavy lifting (more than 15-20lbs) or pushing until cleared for these activities by your physician.  Hygiene:  Do not soak your incisions.  Showers are acceptable but you may not take a bath or swim in a pool.  Cleanse your incisions daily with soap and water.  Medications:  Please resume taking any medications that you were taking prior to the surgery.  If we have prescribed any new medications for you, please take them as directed.  Constipation:  It is fairly common to experience some difficulty in moving your bowels following major surgery.  Being active will help to reduce this likelihood. A diet rich in fiber and plenty of liquids is desirable.  If you do become constipated, a mild laxative such as Miralax, Milk of Magnesia, or Metamucil, or a stool softener such as Colace, is recommended.  General Instructions: If you develop a fever of 100.5 degrees or higher, please call the office number(s) below for physician on call.  For work, no lifting more than 15lbs for 4 weeks after the surgery. You should be fine to go back to work with this weight restriction when your maternity leave is up no 10/23/2016.   Cornelia Copa MD Attending [facility] for [facility] ([facility]) 10/16/2016 Time: [phone] General Anesthesia, Adult, Care After These instructions provide you with information about caring for  yourself after your procedure. Your health care provider may also give you more specific instructions. Your treatment has been planned according to current medical practices, but problems sometimes occur. Call your health care provider if you have any problems or questions after your procedure. What can I expect after the procedure? After the procedure, it is common to have:  Vomiting.  A sore throat.  Mental slowness. It is common to feel:  Nauseous.  Cold or shivery.  Sleepy.  Tired.  Sore or achy, even in parts of your body where you did not have surgery. Follow these instructions at home: For at least 24 hours after the procedure:   Do not:  Participate in activities where you could fall or become injured.  Drive.  Use heavy machinery.  Drink alcohol.  Take sleeping pills or medicines that cause drowsiness.  Make important decisions or sign legal documents.  Take care of children on your own.  Rest. Eating and drinking   If you vomit, drink water, juice, or soup when you can drink without vomiting.  Drink enough fluid to keep your urine clear or pale yellow.  Make sure you have little or no nausea before eating solid foods.  Follow the diet recommended by your health care provider. General instructions   Have a responsible adult stay with you until you are awake and alert.  Return to your normal activities as told by your health care provider. Ask your health care provider what activities are safe for you.  Take over-the-counter and prescription medicines only as told by your health care provider.  If you smoke, do not smoke without supervision.  Keep all follow-up visits as told by your health care provider. This is important. Contact a health care provider if:  You continue to have nausea or vomiting at home, and medicines are not helpful.  You cannot drink fluids or start eating again.  You cannot urinate after 8-12 hours.  You develop a skin  rash.  You have fever.  You have increasing redness at the site of your procedure. Get help right away if:  You have difficulty breathing.  You have chest pain.  You have unexpected bleeding.  You feel that you are having a life-threatening or urgent problem. This information is not intended to replace advice given to you by your health care provider. Make sure you discuss any questions you have with your health care provider. Document Released: 10/01/2000 Document Revised: 11/28/2015 Document Reviewed: 06/09/2015 Elsevier Interactive Patient Education  2017 [employer].

## 2016-10-16 NOTE — Anesthesia Procedure Notes (Signed)
Procedure Name: Intubation Date/Time: 10/16/2016 7:37 AM Performed by: Jonna Munro Pre-anesthesia Checklist: Patient identified, Emergency Drugs available, Suction available, Timeout performed and Patient being monitored Patient Re-evaluated:Patient Re-evaluated prior to inductionOxygen Delivery Method: Circle system utilized Preoxygenation: Pre-oxygenation with 100% oxygen Intubation Type: IV induction Ventilation: Mask ventilation without difficulty Laryngoscope Size: Mac and 3 Grade View: Grade I Tube type: Oral Tube size: 7.0 mm Number of attempts: 1 Airway Equipment and Method: Stylet Placement Confirmation: positive ETCO2,  ETT inserted through vocal cords under direct vision and breath sounds checked- equal and bilateral Secured at: 21 cm Tube secured with: Tape Dental Injury: Teeth and Oropharynx as per pre-operative assessment

## 2016-10-17 ENCOUNTER — Encounter (HOSPITAL_COMMUNITY): Payer: Self-pay | Admitting: Obstetrics and Gynecology

## 2016-10-23 ENCOUNTER — Telehealth: Payer: Self-pay | Admitting: *Deleted

## 2016-10-23 NOTE — Telephone Encounter (Signed)
Patient needs letter from MD stating she can return to work. States she has been on maternity leave since 1/23. Please call patient when letter is ready for pickup.

## 2016-10-23 NOTE — Telephone Encounter (Signed)
Will forward to MD to write this letter. Jazmin Hartsell,CMA  

## 2016-10-24 NOTE — Telephone Encounter (Signed)
Attempted to call Sabrina Buckley-no answer, VM was left informing of letter for work ready for pick up at [facility]

## 2016-10-24 NOTE — Telephone Encounter (Signed)
Letter printed and left at front.  Katina Degree. Jimmey Ralph, MD [facility] Resident PGY-3 10/24/2016 10:40 AM

## 2016-11-01 ENCOUNTER — Ambulatory Visit (INDEPENDENT_AMBULATORY_CARE_PROVIDER_SITE_OTHER): Payer: Self-pay | Admitting: Obstetrics and Gynecology

## 2016-11-01 VITALS — BP 122/75 | HR 68 | Ht 63.0 in | Wt 191.2 lb

## 2016-11-01 DIAGNOSIS — N911 Secondary amenorrhea: Secondary | ICD-10-CM

## 2016-11-01 DIAGNOSIS — Z9889 Other specified postprocedural states: Secondary | ICD-10-CM

## 2016-11-01 DIAGNOSIS — Z09 Encounter for follow-up examination after completed treatment for conditions other than malignant neoplasm: Secondary | ICD-10-CM

## 2016-11-01 MED ORDER — MEDROXYPROGESTERONE ACETATE 10 MG PO TABS: 10.0000 mg | ORAL_TABLET | Freq: Every day | ORAL | 0 refills | Status: DC

## 2016-11-01 NOTE — Patient Instructions (Addendum)
Take the Provera  daily by mouth for 14 days. You should expect a period to start a few days after stopping the pill If your period starts and goes away like normal, then you should expect to get a regularly monthly period from that point on. If you don't get a regular, monthly period after stopping the pills call us.  If you don't get a period at all after the 14 day course of the pills, please call us for a visit.

## 2016-11-01 NOTE — Progress Notes (Signed)
Obstetrics and Gynecology Post Op Evaluation  Appointment Date: 11/01/2016  OBGYN Clinic: [facility]  Primary Care Provider: Jacquiline Doe  Chief Complaint: post op check History of Present Illness: Sabrina Buckley is a 31 y.o. African-American [id number] s/p 4/10 l/s BTL via filshie clips. She was discharged to home on POD#0  She went back to work a few days later and has had no problems or issues. The only thing she notes is that she has been amenorrheic since her delivery on 1/23.  Her UPT prior to surgery was negative and she was abstinent prior to surgery; she is formula feeding and she states her periods prior to this pregnancy were qmonth and regular.     Review of Systems: as noted in the History of Present Illness.   Past Medical History:  Past Medical History:  Diagnosis Date  . Abdominal adhesions    see 10/16/2016 op note  . Anemia   . Chlamydia   . H/O Fitz-Hugh-Curtis syndrome   . SVD (spontaneous vaginal delivery) x 2  . Vaginal Pap smear, abnormal   . Ventral hernia     Past Surgical History:  Past Surgical History:  Procedure Laterality Date  . CESAREAN SECTION     x 1  . HERNIA REPAIR    . LAPAROSCOPIC TUBAL LIGATION Bilateral 10/16/2016   Procedure: LAPAROSCOPIC TUBAL LIGATION;  Surgeon: Knightsen Bing, MD;  Location: [facility];  Service: Gynecology;  Laterality: Bilateral;  . LAPAROSCOPY  10/16/2016   Procedure: LAPAROSCOPY DIAGNOSTIC;  Surgeon: Opal Bing, MD;  Location: [facility];  Service: Gynecology;;  . UMBILICAL HERNIA REPAIR N/A 2013   with mesh    Past Obstetrical History:  OB History  Gravida Para Term Preterm AB Living  SAB TAB Ectopic Multiple Live Births  1     0 3    # Outcome Date GA Lbr Len/2nd Weight Sex Delivery Anes PTL Lv  4 Term 07/31/16 [redacted]w[redacted]d / 00:20 6 lb 12.5 oz (3.075 kg) F VBAC EPI  LIV  3 SAB 10/08/11 [redacted]w[redacted]d    SAB  N      Birth Comments: SAB at 10-11 weeks. Did not require  instrumentation or medication, was fully spontaneous.  2 Preterm 07/24/06 [redacted]w[redacted]d  5 lb 10 oz (2.551 kg) F CS-Unspec  N LIV     Birth Comments: Delivered at 35 weeks via cesarean section at [facility] due to "arrythmia" of baby's heartbeat. Child discharged from nursery on propranolol and has grown out of cardiac issue, follows up regularly with cardiologist each year.  1 Term 04/22/02   6 lb 14 oz (3.118 kg) M Vag-Spont  Y LIV     Complications: Cervical abnormality complicating pregnancy in third trimester     Birth Comments: Required bed rest from 6 mos to delivery due to premature dilation. Delivered full term, no other issues during that pregnancy.      Past Gynecological History: As per HPI. Pap neg 2018  Social History:  Social History   Social History  . Marital status: Single    Spouse name: N/A  . Number of children: N/A  . Years of education: N/A   Occupational History  . Not on file.   Social History Main Topics  . Smoking status: Current Every Day Smoker    Packs/day: 0.50    Years: 10.00    Types: Cigarettes  . Smokeless tobacco: Never Used  . Alcohol use No  . Drug  use: No  . Sexual activity: Yes    Birth control/ protection: Condom   Other Topics Concern  . Not on file   Social History Narrative  . No narrative on file    Family History: No family history on file.   Medications None  Allergies Patient has no known allergies.   Physical Exam:  BP 122/75   Pulse 68   Ht  (1.6 m)   Wt 191 lb 3.2 oz (86.7 kg)   LMP  (LMP Unknown)   BMI 33.87 kg/m  Body mass index is 33.87 kg/m. General appearance: Well nourished, well developed female in no acute distress.  Abdomen: soft, nttp, nd. Well healed port sites x 2. No he/o hernias.    Laboratory: none  Radiology: none  Assessment: normal post op exam. 2ndry amenorrhea  Plan:  1. Secondary amenorrhea Provera withdrawal test and pt told to call if no bleeding occurs s/p test or if she does have  a period but it's not qmonth or regular.   2. Postop check Routine. D/w pt re: adhesions and to call with any s/s of pregnancy, given ectopic risk after BTL  RTC PRN  Cornelia Copa MD Attending Center for [facility])

## 2016-12-14 NOTE — Addendum Note (Signed)
Addendum  created 12/14/16 1149 by Talena Neira, MD   Sign clinical note    

## 2017-02-11 ENCOUNTER — Ambulatory Visit: Payer: Medicaid Other | Admitting: Internal Medicine

## 2017-03-12 ENCOUNTER — Encounter: Payer: Self-pay | Admitting: Internal Medicine

## 2017-03-12 ENCOUNTER — Ambulatory Visit (INDEPENDENT_AMBULATORY_CARE_PROVIDER_SITE_OTHER): Payer: Self-pay | Admitting: Internal Medicine

## 2017-03-12 DIAGNOSIS — M7712 Lateral epicondylitis, left elbow: Secondary | ICD-10-CM

## 2017-03-12 MED ORDER — MELOXICAM 7.5 MG PO TABS: 7.5000 mg | ORAL_TABLET | Freq: Every day | ORAL | 0 refills | Status: DC

## 2017-03-12 MED ORDER — DICLOFENAC SODIUM 1 % TD GEL: 4.0000 g | Freq: Four times a day (QID) | TRANSDERMAL | 0 refills | Status: DC

## 2017-03-12 NOTE — Progress Notes (Signed)
   [facility] Phone: [phone]  Subjective:  Sabrina Buckley is a 31 year old female presenting to clinic with left forearm pain for the last week. She states she woke up one morning in pain. She thought she slept wrong, but the pain continued. She does not remember doing anything to cause the pain. She does not remember doing any repetitive movements the day before the pain started. The pain is located on the lateral side of her forearm, near her elbow. The pain radiates down her forearm to her thumb. The pain is worse with touching the area, holding objects in her hand, and lifting/moving patients at work. 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 The pain is "achy" and constant. She rates the pain as a 7/10. She denies any trauma to the area. She denies any weakness in her hand or forearm. She has noticed occasional mild numbness in her fingertips.  ROS: See HPI for pertinent positives and negatives  Past Medical History- GERD, anemia, obesity  Family history reviewed for today's visit. No changes.  Social history- patient is a current smoker. 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  Objective: BP 110/80   Pulse 65   Temp 98.9 F (37.2 C) (Oral)   Wt 197 lb (89.4 kg)   SpO2 99%   BMI 34.90 kg/m  Gen: NAD, alert, cooperative with exam Left arm: No edema, erythema, or gross deformity noted; tenderness to palpation along the extensor tendons, especially at the attachment point to the lateral epicondyle; +pain with resisted extension at the wrist; no pain with resisted flexion at the wrist. Normal ROM of the elbow and wrist. Left hand is warm and well-perfused. Mild tenderness along the abductor pollicis brevis Neuro: Sensation intact to light touch throughout the left arm, hand, and fingers; 5/5 grip strength bilaterally  Assessment/Plan: Left Lateral Epicondylitis: Pt with left forearm pain consistent with lateral epicondylitis, due to point tenderness over the area where the  extensor tendons attach to the lateral epicondyle. Patient also having pain with resisted extension at the wrist. Likely due to overuse injury at work. No bony tenderness or trauma to suggest fracture. Full ROM without pain at the elbow, making joint effusion, bursitis, or septic joint unlikely. - Treat with Mobic 7.5mg  daily x 14 days - Voltaren gel qid - Advised patient to rest and use heat/ice prn - Handout for home rehabilitation exercises given - Provided work note stating that patient should be restricted to light activity for the next week - Follow-up in 2-3 weeks if no improvement.   Willadean CarolKaty Mayo, MD PGY-3

## 2017-03-12 NOTE — Patient Instructions (Signed)
It was so nice to see you!  I think you have tennis elbow, which is inflammation and irritation of your forearm tendons. I have prescribed an anti-inflammatory medication called Meloxicam. Please take 1 tablet daily. Please do not take Ibuprofen at the same time. You can take Tylenol with the Meloxicam. I have also prescribed some Voltaren gel. You can use this 4 times per day.  Please rest your arm as much as possible over the next week, then start the home exercises in the handout.  Please come back to see us if you are not getting better after 2 weeks.  -Dr. Nancy MarusMayo

## 2017-03-13 DIAGNOSIS — M7712 Lateral epicondylitis, left elbow: Secondary | ICD-10-CM | POA: Insufficient documentation

## 2017-03-13 NOTE — Assessment & Plan Note (Signed)
Pt with left forearm pain consistent with lateral epicondylitis, due to point tenderness over the area where the extensor tendons attach to the lateral epicondyle. Patient also having pain with resisted extension at the wrist. Likely due to overuse injury at work. No bony tenderness or trauma to suggest fracture. Full ROM without pain at the elbow, making joint effusion, bursitis, or septic joint unlikely. - Treat with Mobic 7.5mg  daily x 14 days - Voltaren gel qid - Advised patient to rest and use heat/ice prn - Handout for home rehabilitation exercises given - Provided work note stating that patient should be restricted to light activity for the next week - Follow-up in 2-3 weeks if no improvement.

## 2017-03-19 ENCOUNTER — Telehealth: Payer: Self-pay | Admitting: Internal Medicine

## 2017-03-19 NOTE — Telephone Encounter (Signed)
Will forward to Dr. Mayo. Jazmin Hartsell,CMA  

## 2017-03-19 NOTE — Telephone Encounter (Signed)
Pt needs letter stating the exact date her restrictions end and it has to say no restrictions-she can return to normal job duties. Please let pt know when it is ready for pickup

## 2017-03-20 ENCOUNTER — Encounter: Payer: Self-pay | Admitting: Internal Medicine

## 2017-03-20 NOTE — Telephone Encounter (Signed)
Letter placed up front for patient to pick up. I called her and let her know.

## 2017-03-21 ENCOUNTER — Encounter: Payer: Self-pay | Admitting: Internal Medicine

## 2017-03-21 ENCOUNTER — Ambulatory Visit (INDEPENDENT_AMBULATORY_CARE_PROVIDER_SITE_OTHER): Payer: Self-pay | Admitting: Internal Medicine

## 2017-03-21 DIAGNOSIS — Z041 Encounter for examination and observation following transport accident: Secondary | ICD-10-CM

## 2017-03-21 MED ORDER — IBUPROFEN 800 MG PO TABS: 800.0000 mg | ORAL_TABLET | Freq: Three times a day (TID) | ORAL | 0 refills | Status: DC | PRN

## 2017-03-21 MED ORDER — CYCLOBENZAPRINE HCL 10 MG PO TABS: 10.0000 mg | ORAL_TABLET | Freq: Three times a day (TID) | ORAL | 0 refills | Status: DC | PRN

## 2017-03-21 NOTE — Patient Instructions (Signed)
I have prescribed Flexeril, a muscle relaxer. This can be sedating so don't take it before driving or if you need to go to work.   I have also sent in Ibuprofen 800 mg. Don't take this with other anti-inflammatory medications including Mobic.   Apply heat to the area as tolerated. Do gentle range of motion exercises so that you don't get too stiff.    Cervical Radiculopathy Cervical radiculopathy means that a nerve in the neck is pinched or bruised. This can cause pain or loss of feeling (numbness) that runs from your neck to your arm and fingers. Follow these instructions at home: Managing pain  Take over-the-counter and prescription medicines only as told by your doctor.  If directed, put ice on the injured or painful area. ? Put ice in a plastic bag. ? Place a towel between your skin and the bag. ? Leave the ice on for 20 minutes, 2-3 times per day.  If ice does not help, you can try using heat. Take a warm shower or warm bath, or use a heat pack as told by your doctor.  You may try a gentle neck and shoulder massage. Activity  Rest as needed. Follow instructions from your doctor about any activities to avoid.  Do exercises as told by your doctor or physical therapist. General instructions  If you were given a soft collar, wear it as told by your doctor.  Use a flat pillow when you sleep.  Keep all follow-up visits as told by your doctor. This is important. Contact a doctor if:  Your condition does not improve with treatment. Get help right away if:  Your pain gets worse and is not controlled with medicine.  You lose feeling or feel weak in your hand, arm, face, or leg.  You have a fever.  You have a stiff neck.  You cannot control when you poop or pee (have incontinence).  You have trouble with walking, balance, or talking. This information is not intended to replace advice given to you by your health care provider. Make sure you discuss any questions you have  with your health care provider. Document Released: 06/14/2011 Document Revised: 12/01/2015 Document Reviewed: 08/19/2014 Elsevier Interactive Patient Education  [employer]2018 Elsevier Inc.
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
## 2017-08-21 ENCOUNTER — Other Ambulatory Visit: Payer: Self-pay

## 2017-08-21 ENCOUNTER — Ambulatory Visit (INDEPENDENT_AMBULATORY_CARE_PROVIDER_SITE_OTHER): Payer: BLUE CROSS/BLUE SHIELD | Admitting: Family Medicine

## 2017-08-21 VITALS — BP 132/80 | HR 84 | Temp 98.4°F | Ht 63.0 in | Wt 207.0 lb

## 2017-08-21 DIAGNOSIS — A084 Viral intestinal infection, unspecified: Secondary | ICD-10-CM

## 2017-08-21 MED ORDER — ONDANSETRON 4 MG PO TBDP: 4.0000 mg | ORAL_TABLET | Freq: Three times a day (TID) | ORAL | 0 refills | Status: DC | PRN

## 2017-08-21 NOTE — Progress Notes (Signed)
   Subjective:    Patient ID: Alta Corningominique N Coombs , female   DOB: 04/22/1986 , 32 y.o..   MRN: [id number]  HPI  Alta CorningDominique N Pinedo is a 32 yo F with PMG of GERD, Obesity, tobacco abuse here for  Chief Complaint  Patient presents with  . Emesis  . Diarrhea  . Chills    1. Emesis, Diarrhea, Chills: Patient notes that since last night she has had watery diarrhea and emesis.  Over the last 24 hours she has had 3 episodes of nonbloody nonbilious emesis and 2 episodes of nonbloody diarrhea.  She describes her emesis as yellow.  She has been unable to eat or drink anything besides [facility] since last night.  This morning while she was getting ready for work she felt very bad but tried to go to work and then was sent home after she vomited.  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  She also endorses subjective fevers and chills last night but has not had any today.  She has not tried any medications.  Admits to mildly runny nose but no cough, abdominal pain, shortness of breath.  She is on her menstrual period.   Review of Systems: Per HPI.   Past Medical History: Patient Active Problem List   Diagnosis Date Noted  . Lateral epicondylitis, left elbow 03/13/2017  . Secondary amenorrhea 11/01/2016  . Anemia 10/16/2016  . OBESITY, UNSPECIFIED 12/14/2008  . GERD 08/04/2008  . TOBACCO DEPENDENCE 09/05/2006    Medications: reviewed   Social Hx:  reports that she has been smoking cigarettes.  She has a 5.00 pack-year smoking history. she has never used smokeless tobacco.   Objective:   BP 132/80 (BP Location: Left Arm, Patient Position: Sitting, Cuff Size: Normal)   Pulse 84   Temp 98.4 F (36.9 C) (Oral)   Ht 5\' 3"  (1.6 m)   Wt 207 lb (93.9 kg)   LMP 08/19/2017   SpO2 97%   Breastfeeding? No   BMI 36.67 kg/m  Physical Exam  Gen: NAD, alert, cooperative with exam,  well-appearing HEENT: NCAT, PERRL, clear conjunctiva, oropharynx clear, supple neck Cardiac: Regular rate and rhythm, normal S1/S2, no murmur, no edema, capillary refill brisk  Respiratory: Clear to auscultation bilaterally, no wheezes, non-labored breathing Gastrointestinal: soft, non tender, non distended, bowel sounds present Skin: no rashes, normal turgor  Neurological: no gross deficits.  Psych: good insight, normal mood and affect  Assessment & Plan:   1. Viral gastroenteritis: Symptoms likely from viral gastroenteritis.  Patient is reassuringly afebrile here with a benign abdominal exam.  She appears well-hydrated. -Prescription for Zofran given for nausea and vomiting -Pepto for diarrhea and upset stomach -Encouraged plenty of fluids -Return precautions discussed  Meds ordered this encounter  Medications  . ondansetron (ZOFRAN ODT) 4 MG disintegrating tablet    Sig: Take 1 tablet (4 mg total) by mouth every 8 (eight) hours as needed for nausea or vomiting.    Dispense:  10 tablet    Refill:  0   Anders Simmondshristina Lavante Toso, MD [facility], PGY-3

## 2017-08-21 NOTE — Patient Instructions (Signed)
Thank you for coming in today, it was so nice to see you! Today we talked about:    Is likely has viral gastroenteritis; I sent a prescription to your pharmacy for Zofran.  You can use this as needed for nausea and vomiting.  For diarrhea and upset stomach you can take Pepto-Bismol.    If you notice that you are having blood in your vomit or stool please go to the hospital.  Other reasons to go to the hospital include worsening abdominal pain or inability to drink anything all day.  Make sure you wash your hands frequently  If you have any questions or concerns, please do not hesitate to call the office at [phone]. You can also message me directly via MyChart.   Sincerely,  Anders Simmondshristina Teo Moede, MD   Viral Gastroenteritis, Adult Viral gastroenteritis is also known as the stomach flu. This condition is caused by certain germs (viruses). These germs can be passed from person to person very easily (are very contagious). This condition can cause sudden watery poop (diarrhea), fever, and throwing up (vomiting). Having watery poop and throwing up can make you feel weak and cause you to get dehydrated. Dehydration can make you tired and thirsty, make you have a dry mouth, and make it so you pee (urinate) less often. Older adults and people with other diseases or a weak defense system (immune system) are at higher risk for dehydration. It is important to replace the fluids that you lose from having watery poop and throwing up. Follow these instructions at home: Follow instructions from your doctor about how to care for yourself at home. Eating and drinking  Follow these instructions as told by your doctor:  Take an oral rehydration solution (ORS). This is a drink that is sold at pharmacies and stores.  Drink clear fluids in small amounts as you are able, such as: ? Water. ? Ice chips. ? Diluted fruit juice. ? Low-calorie sports drinks.  Eat bland, easy-to-digest foods in small amounts as  you are able, such as: ? Bananas. ? Applesauce. ? Rice. ? Low-fat (lean) meats. ? Toast. ? Crackers.  Avoid fluids that have a lot of sugar or caffeine in them.  Avoid alcohol.  Avoid spicy or fatty foods.  General instructions  Drink enough fluid to keep your pee (urine) clear or pale yellow.  Wash your hands often. If you cannot use soap and water, use hand sanitizer.  Make sure that all people in your home wash their hands well and often.  Rest at home while you get better.  Take over-the-counter and prescription medicines only as told by your doctor.  Watch your condition for any changes.  Take a warm bath to help with any burning or pain from having watery poop.  Keep all follow-up visits as told by your doctor. This is important. Contact a doctor if:  You cannot keep fluids down.  Your symptoms get worse.  You have new symptoms.  You feel light-headed or dizzy.  You have muscle cramps. Get help right away if:  You have chest pain.  You feel very weak or you pass out (faint).  You see blood in your throw-up.  Your throw-up looks like coffee grounds.  You have bloody or black poop (stools) or poop that look like tar.  You have a very bad headache, a stiff neck, or both.  You have a rash.  You have very bad pain, cramping, or bloating in your belly (abdomen).  You  have trouble breathing.  You are breathing very quickly.  Your heart is beating very quickly.  Your skin feels cold and clammy.  You feel confused.  You have pain when you pee.  You have signs of dehydration, such as: ? Dark pee, hardly any pee, or no pee. ? Cracked lips. ? Dry mouth. ? Sunken eyes. ? Sleepiness. ? Weakness. This information is not intended to replace advice given to you by your health care provider. Make sure you discuss any questions you have with your health care provider. Document Released: 12/12/2007 Document Revised: 01/13/2016 Document Reviewed:  03/01/2015 Elsevier Interactive Patient Education  2017 [employer].

## 2017-12-20 ENCOUNTER — Encounter: Payer: BLUE CROSS/BLUE SHIELD | Admitting: Internal Medicine

## 2017-12-30 ENCOUNTER — Ambulatory Visit (INDEPENDENT_AMBULATORY_CARE_PROVIDER_SITE_OTHER): Payer: BLUE CROSS/BLUE SHIELD | Admitting: Internal Medicine

## 2017-12-30 ENCOUNTER — Encounter: Payer: Self-pay | Admitting: Internal Medicine

## 2017-12-30 ENCOUNTER — Other Ambulatory Visit: Payer: Self-pay

## 2017-12-30 DIAGNOSIS — M79671 Pain in right foot: Secondary | ICD-10-CM

## 2017-12-30 NOTE — Progress Notes (Signed)
32 y.o. year old female presents for well woman/preventative visit and annual GYN examination.  Acute Concerns: Right foot pain- started a couple of week ago after she wore high heels. The pain is located in her forefoot at the base of her 2nd and 3rd toes. The pain is sharp. The pain is worse with walking. She did not injure her foot. No swelling, erythema, or warmth. The pain has been getting better on its own.  Diet: Eats a lot of quick meals because she is busy. Does not eat 5 fruits and vegetables per day.   Exercise: Does not exercise, but is pretty active with her job  Sexual/Birth History: Sexually active with one female partner.  Birth Control: BTL  Social:  Social History   Socioeconomic History  . Marital status: Single    Spouse name: Not on file  . Number of children: Not on file  . Years of education: Not on file  . Highest education level: Not on file  Occupational History  . Not on file  Social Needs  . Financial resource strain: Not on file  . Food insecurity:    Worry: Not on file    Inability: Not on file  . Transportation needs:    Medical: Not on file    Non-medical: Not on file  Tobacco Use  . Smoking status: Current Every Day Smoker    Packs/day: 0.50    Years: 10.00    Pack years: 5.00    Types: Cigarettes  . Smokeless tobacco: Never Used  Substance and Sexual Activity  . Alcohol use: No  . Drug use: No  . Sexual activity: Yes    Birth control/protection: Condom  Lifestyle  . Physical activity:    Days per week: Not on file    Minutes per session: Not on file  . Stress: Not on file  Relationships  . Social connections:    Talks on phone: Not on file    Gets together: Not on file    Attends religious service: Not on file    Active member of club or organization: Not on file    Attends meetings of clubs or organizations: Not on file    Relationship status: Not on file  Other Topics Concern  . Not on file  Social History Narrative  . Not  on file    Immunization: Immunization History  Administered Date(s) Administered  . Influenza,inj,Quad PF,6+ Mos 05/18/2016  . Influenza-Unspecified 07/09/2012  . Td 08/09/1997  . Tdap 05/18/2016    Cancer Screening:  Pap Smear: not due  Physical Exam: VITALS: Reviewed GEN: Pleasant female, NAD HEENT: Normocephalic, PERRL, EOMI, no scleral icterus, nasal septum midline, MMM, uvula midline, no anterior or posterior lymphadenopathy, no thyromegaly CARDIAC:RRR, S1 and S2 present, no murmur, no heaves/thrills RESP: CTAB, normal effort ABD: soft, no tenderness, normal bowel sounds EXT: No edema, 2+ radial and DP pulses SKIN: no rash  ASSESSMENT & PLAN: 32 y.o. female presents for annual well woman/preventative exam.  Nutrition Counseling: - Recommended 5 fruits and vegetables daily  Right Foot Pain: Likely related to wearing heels, as this is what started her pain. Already getting better on its own. No injury, so not concerned for fracture. No numbness, tingling, or burning pain between the toes to suggest Morton's neuroma. No pain along the arch to suggest plantar fasciitis. - Recommended Tylenol and Ibuprofen and ice - Follow-up if no improvement in 4-6 weeks.  Willadean CarolKaty Mayo, MD PGY-3

## 2017-12-30 NOTE — Patient Instructions (Signed)
It was so wonderful to see you today!  You can use ice on your foot and you can also try Ibuprofen and Tylenol. If it doesn't get better in the next couple of weeks or if it suddenly gets worse, please come back to see us!  We talked about diet and exercise today. I would recommend starting with trying to eat 5 fruits/vegetables per day. I would also recommend trying to find little ways to exercise during the day, such as taking 3 laps around the grocery store or [employer] before doing your shopping, or parking far away from the store in a parking lot.  We will see you back in 1 year or earlier if needed!  -Dr. Nancy MarusMayo

## 2018-01-01 DIAGNOSIS — M79671 Pain in right foot: Secondary | ICD-10-CM | POA: Insufficient documentation

## 2018-01-01 NOTE — Assessment & Plan Note (Addendum)
Likely related to wearing heels, as this is what started her pain. Already getting better on its own. No injury, so not concerned for fracture. No numbness, tingling, or burning pain between the toes to suggest Morton's neuroma. No pain along the arch to suggest plantar fasciitis. - Recommended Tylenol and Ibuprofen and ice - Follow-up if no improvement in 4-6 weeks.

## 2018-04-17 IMAGING — US US OB COMP LESS 14 WK
1 series · 15 of 28 positions shown · non-contrast
Comparison: None for this pregnancy

CLINICAL DATA: 30-year-old pregnant female with bleeding and
spotting.

EXAM:
OBSTETRIC <14 WK ULTRASOUND
TECHNIQUE: Transabdominal ultrasound was performed for evaluation of the
gestation as well as the maternal uterus and adnexal regions.

[Series 1: us ob comp less 14 wk · 15 of 29 slices shown]
[im 1/29]
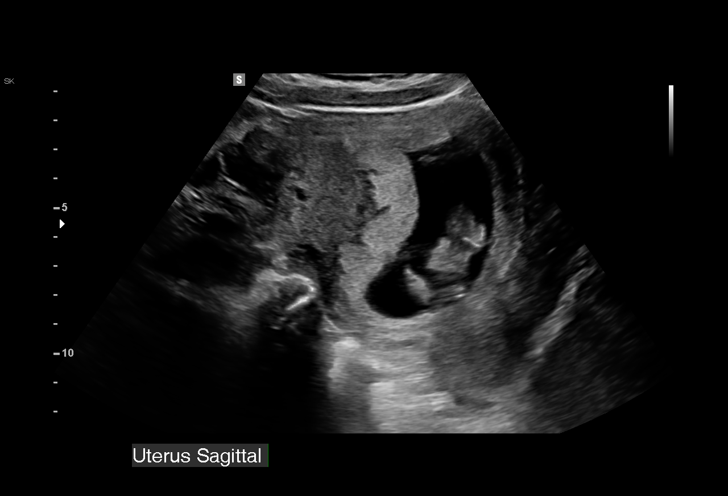
[im 3/29]
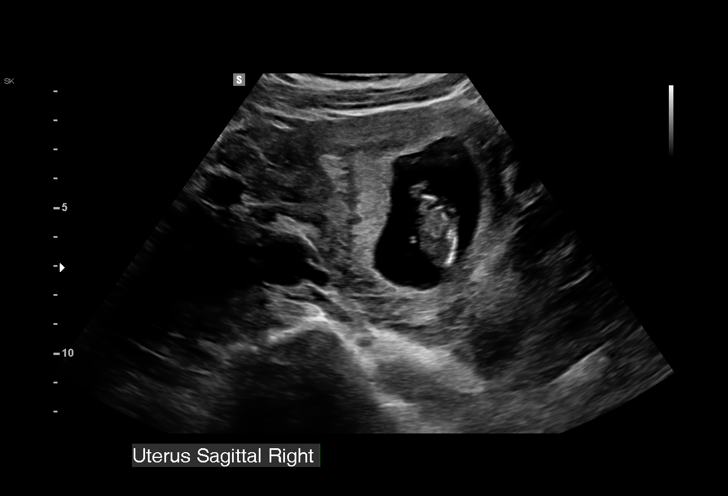
[im 5/29]
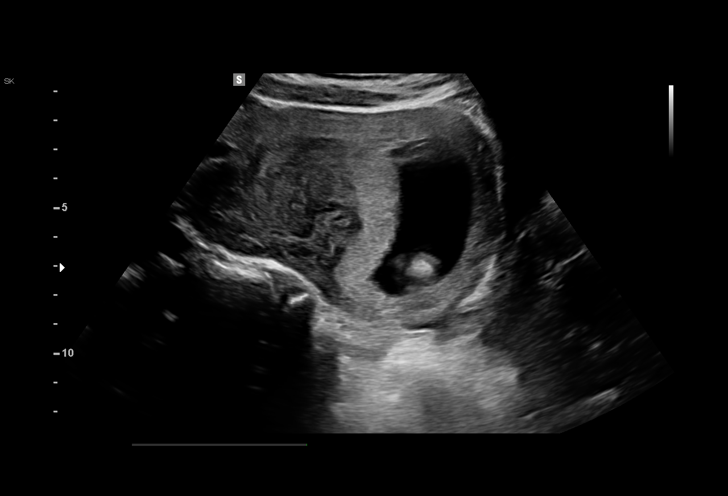
[im 7/29]
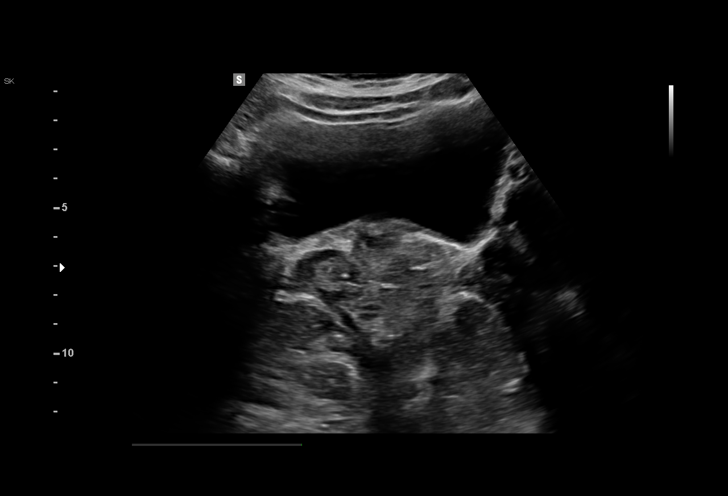
[im 9/29]
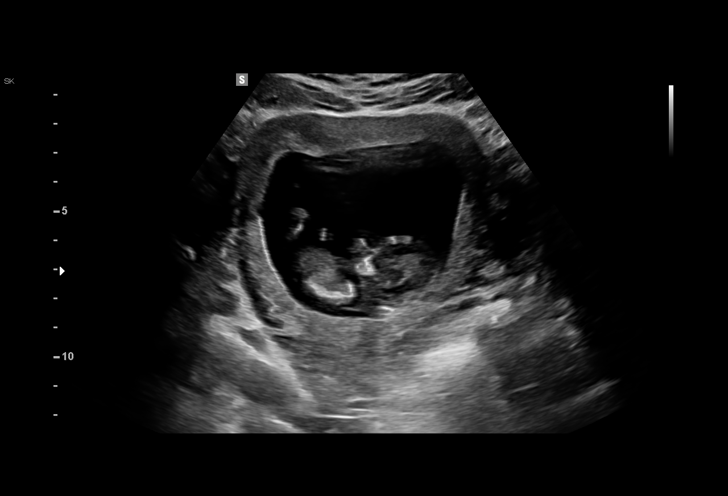
[im 11/29]
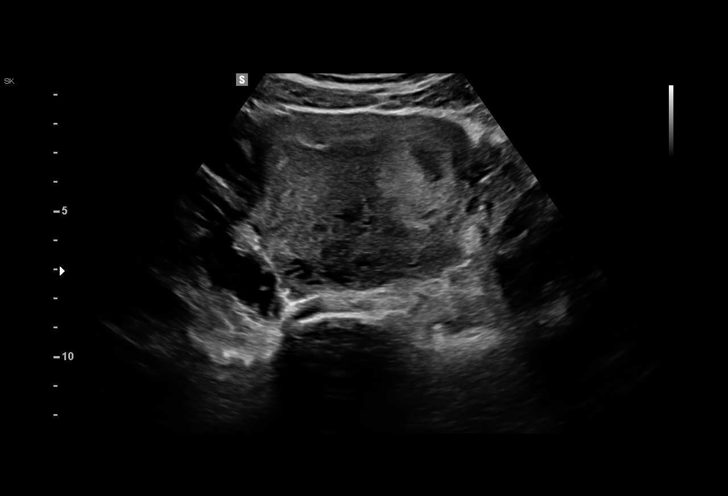
[im 13/29]
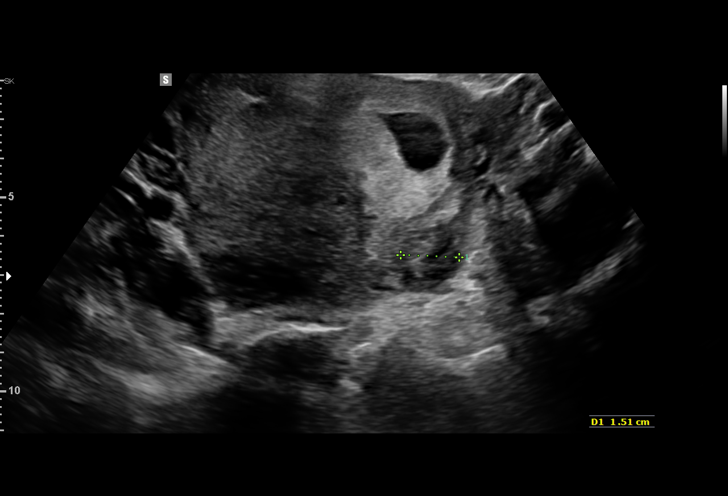
[im 15/29]
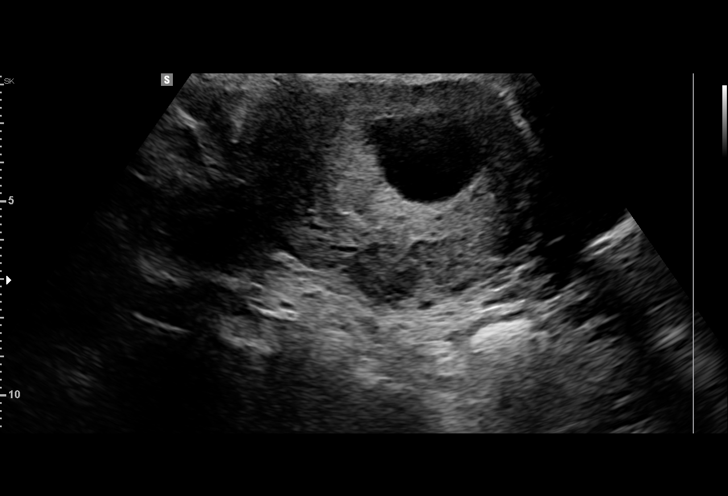
[im 16/29]
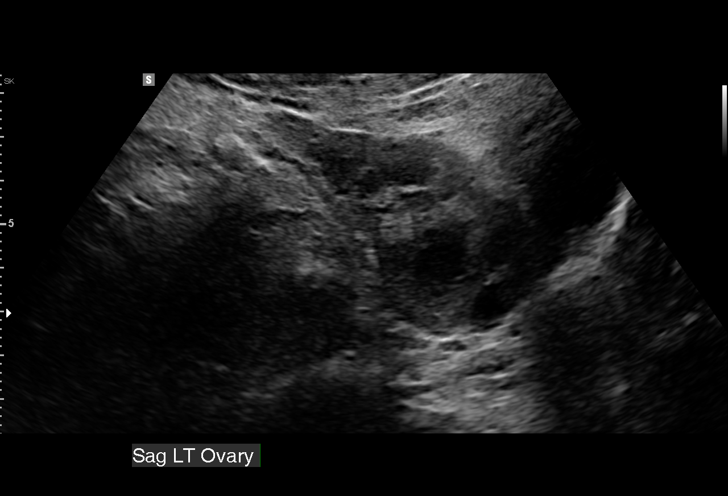
[im 18/29]
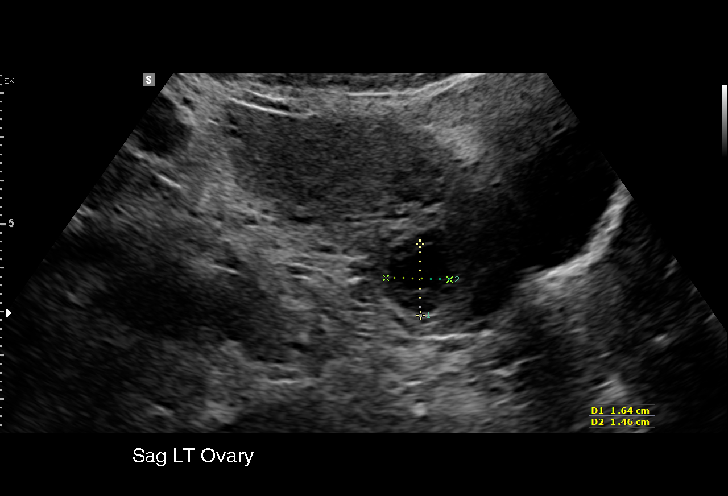
[im 20/29]
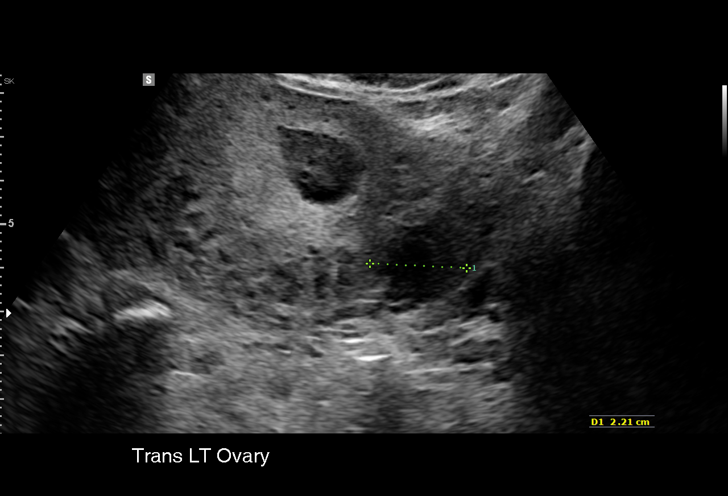
[im 22/29]
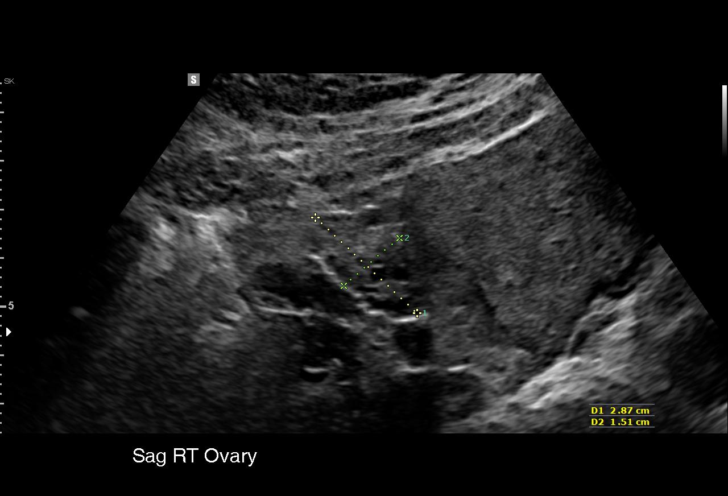
[im 24/29]
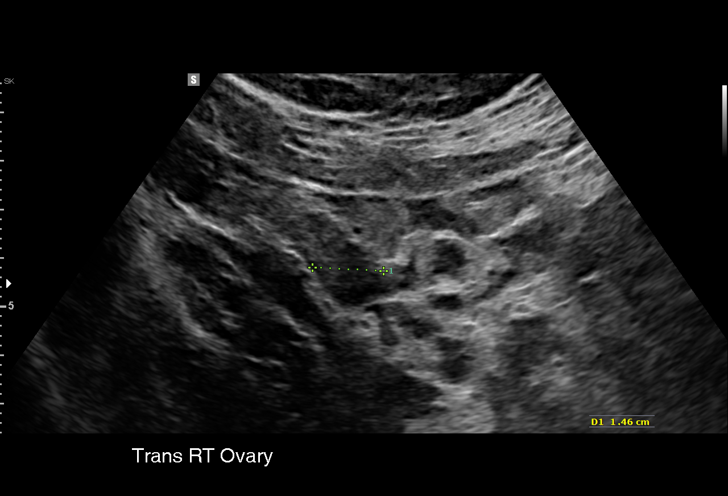
[im 26/29]
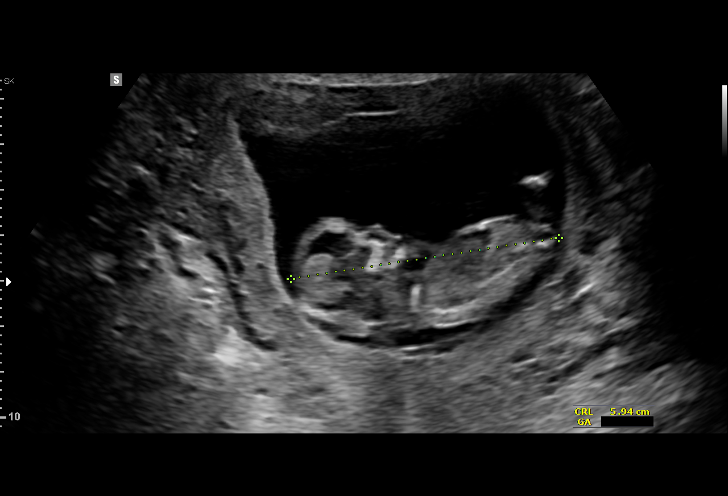
[im 29/29]
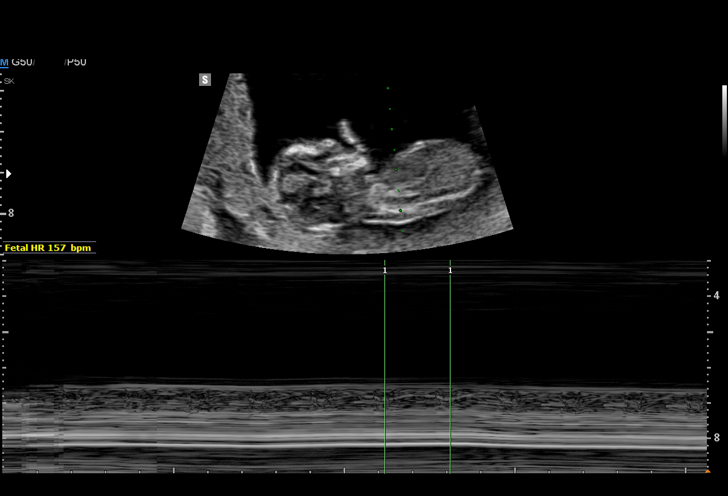

[15 of 28 positions shown; findings below may reference images not displayed]

FINDINGS: Intrauterine gestational sac: Single

Yolk sac:  None visualized

Embryo:  Present

Cardiac Activity: Detected

Heart Rate: 157 bpm

CRL:   60  mm   12 w 4 d                  US EDC: 07/27/2016

Subchorionic hemorrhage:  None visualized.

Maternal uterus/adnexae: The maternal ovaries appear unremarkable.
There is a 1.6 x 1.5 x 1.3 cm corpus luteum in the left ovary. There
is a 1.8 x 1.5 x 1.5 cm fibroid in the left uterine body.

No free fluid within the pelvis.
IMPRESSION: Single live intrauterine pregnancy with an estimated gestational age
of 12 weeks, 4 days.

## 2018-04-22 ENCOUNTER — Other Ambulatory Visit: Payer: Self-pay

## 2018-04-22 ENCOUNTER — Ambulatory Visit (INDEPENDENT_AMBULATORY_CARE_PROVIDER_SITE_OTHER): Payer: BLUE CROSS/BLUE SHIELD | Admitting: Family Medicine

## 2018-04-22 DIAGNOSIS — L0293 Carbuncle, unspecified: Secondary | ICD-10-CM | POA: Diagnosis not present

## 2018-04-22 MED ORDER — CEPHALEXIN 500 MG PO CAPS: 500.0000 mg | ORAL_CAPSULE | Freq: Four times a day (QID) | ORAL | 0 refills | Status: AC

## 2018-04-22 NOTE — Progress Notes (Signed)
     Subjective: Chief Complaint  Patient presents with  . Recurrent Skin Infections   HPI: Sabrina Buckley is a 32 y.o. presenting to clinic today to discuss the following:  Folliculitis/Carbuncle Patient presents for 5 days of a lump on the inside or her right thigh in the groin region that has been getting bigger. It is not painful, it is tender to touch, and she has noticed no drainage. Patient states she last had this in 2014 although she thinks it just got better on its own. It is occurring in an area where she shaves regularly.  She denies fever, chills, and no other rashes, boils, or masses of her skin  ROS noted in HPI.   Past Medical, Surgical, Social, and Family History Reviewed & Updated per EMR.   Pertinent Historical Findings include:   Social History   Tobacco Use  Smoking Status Current Every Day Smoker  . Packs/day: 0.50  . Years: 10.00  . Pack years: 5.00  . Types: Cigarettes  Smokeless Tobacco Never Used    Objective: BP 100/68   Pulse 76   Temp 98.1 F (36.7 C) (Oral)   Ht 5\' 3"  (1.6 m)   Wt 208 lb 12.8 oz (94.7 kg)   LMP 04/10/2018 (Approximate)   SpO2 98%   BMI 36.99 kg/m  Vitals and nursing notes reviewed  Physical Exam Gen: Alert and Oriented x 3, NAD HEENT: Normocephalic, atraumatic CV: RRR, no murmurs, normal S1, S2 split Resp: CTAB, no wheezing, rales, or rhonchi, comfortable work of breathing Abd: non-distended, non-tender, soft, +bs in all four quadrants Ext: no clubbing, cyanosis, or edema Skin: warm, dry, intact, no rashes; 2x3cm nodular palpable mass located on the inside of the right thigh at the groin region. Not erythematous, warm, but it TTP.  No results found for this or any previous visit (from the past 72 hour(s)).  Assessment/Plan:  Carbuncle Patient most likely has/had foliculitis that has now developed into a carbuncle given that it is in an area with sweat glands and hair. Patient advised to continue warm  compress and antiseptic technique and to stop shaving the area until it resolves. - Keflex 500mg  four times per day for 5 days.   PATIENT EDUCATION PROVIDED: See AVS    Diagnosis and plan along with any newly prescribed medication(s) were discussed in detail with this patient today. The patient verbalized understanding and agreed with the plan. Patient advised if symptoms worsen return to clinic or ER.   Health Maintainance:   No orders of the defined types were placed in this encounter.   Meds ordered this encounter  Medications  . cephALEXin (KEFLEX) 500 MG capsule    Sig: Take 1 capsule (500 mg total) by mouth 4 (four) times daily for 5 days.    Dispense:  20 capsule    Refill:  0     Jules Schick, DO 04/22/2018, 4:05 PM PGY-2 [facility]

## 2018-04-22 NOTE — Patient Instructions (Signed)
It was great to meet you today! Thank you for letting me participate in your care!  Today, we discussed your skin bump, which I believe is folliculitis. Since this is recurrent I will attempt to treat this with antibiotics. Please take it as prescribed. Please return if it gets worse, begins to become painful, spread, turn red, and/or become warm to touch.  If antibiotics do NOT work, please call and schedule an appointment in our dermatology clinic to have an incision and drainage. Folliculitis Folliculitis is inflammation of the hair follicles. Folliculitis most commonly occurs on the scalp, thighs, legs, back, and buttocks. However, it can occur anywhere on the body. What are the causes? This condition may be caused by:  A bacterial infection (common).  A fungal infection.  A viral infection.  Coming into contact with certain chemicals, especially oils and tars.  Shaving or waxing.  Applying greasy ointments or creams to your skin often.  Long-lasting folliculitis and folliculitis that keeps coming back can be caused by bacteria that live in the nostrils. What increases the risk? This condition is more likely to develop in people with:  A weakened immune system.  Diabetes.  Obesity.  What are the signs or symptoms? Symptoms of this condition include:  Redness.  Soreness.  Swelling.  Itching.  Small white or yellow, pus-filled, itchy spots (pustules) that appear over a reddened area. If there is an infection that goes deep into the follicle, these may develop into a boil (furuncle).  A group of closely packed boils (carbuncle). These tend to form in hairy, sweaty areas of the body.  How is this diagnosed? This condition is diagnosed with a skin exam. To find what is causing the condition, your health care provider may take a sample of one of the pustules or boils for testing. How is this treated? This condition may be treated by:  Applying warm compresses to the  affected areas.  Taking an antibiotic medicine or applying an antibiotic medicine to the skin.  Applying or bathing with an antiseptic solution.  Taking an over-the-counter medicine to help with itching.  Having a procedure to drain any pustules or boils. This may be done if a pustule or boil contains a lot of pus or fluid.  Laser hair removal. This may be done to treat long-lasting folliculitis.  Follow these instructions at home:  If directed, apply heat to the affected area as often as told by your health care provider. Use the heat source that your health care provider recommends, such as a moist heat pack or a heating pad. ? Place a towel between your skin and the heat source. ? Leave the heat on for 20-30 minutes. ? Remove the heat if your skin turns bright red. This is especially important if you are unable to feel pain, heat, or cold. You may have a greater risk of getting burned.  If you were prescribed an antibiotic medicine, use it as told by your health care provider. Do not stop using the antibiotic even if you start to feel better.  Take over-the-counter and prescription medicines only as told by your health care provider.  Do not shave irritated skin.  Keep all follow-up visits as told by your health care provider. This is important. Get help right away if:  You have more redness, swelling, or pain in the affected area.  Red streaks are spreading from the affected area.  You have a fever. This information is not intended to replace advice given  to you by your health care provider. Make sure you discuss any questions you have with your health care provider. Document Released: 09/03/2001 Document Revised: 01/13/2016 Document Reviewed: 04/15/2015 Elsevier Interactive Patient Education  2018 [employer].   Be well, Jules Schick, DO PGY-2, [name]

## 2018-04-27 DIAGNOSIS — L0293 Carbuncle, unspecified: Secondary | ICD-10-CM | POA: Insufficient documentation

## 2018-04-28 NOTE — Assessment & Plan Note (Signed)
Patient most likely has/had foliculitis that has now developed into a carbuncle given that it is in an area with sweat glands and hair. Patient advised to continue warm compress and antiseptic technique and to stop shaving the area until it resolves. - Keflex 500mg  four times per day for 5 days.

## 2018-05-22 ENCOUNTER — Ambulatory Visit: Payer: BLUE CROSS/BLUE SHIELD

## 2018-05-25 ENCOUNTER — Other Ambulatory Visit: Payer: Self-pay

## 2018-05-25 ENCOUNTER — Ambulatory Visit (HOSPITAL_COMMUNITY)
Admission: EM | Admit: 2018-05-25 | Discharge: 2018-05-25 | Disposition: A | Discharge status: Home / Self Care | Payer: BLUE CROSS/BLUE SHIELD | Attending: Family Medicine | Admitting: Family Medicine

## 2018-05-25 ENCOUNTER — Encounter (HOSPITAL_COMMUNITY): Payer: Self-pay | Admitting: Emergency Medicine

## 2018-05-25 DIAGNOSIS — F17210 Nicotine dependence, cigarettes, uncomplicated: Secondary | ICD-10-CM | POA: Insufficient documentation

## 2018-05-25 DIAGNOSIS — N898 Other specified noninflammatory disorders of vagina: Secondary | ICD-10-CM | POA: Diagnosis present

## 2018-05-25 DIAGNOSIS — D649 Anemia, unspecified: Secondary | ICD-10-CM | POA: Insufficient documentation

## 2018-05-25 DIAGNOSIS — N760 Acute vaginitis: Secondary | ICD-10-CM | POA: Diagnosis not present

## 2018-05-25 MED ORDER — FLUCONAZOLE 150 MG PO TABS: 150.0000 mg | ORAL_TABLET | Freq: Once | ORAL | 0 refills | Status: AC

## 2018-05-25 NOTE — ED Provider Notes (Signed)
[facility]    CSN: [id number] Arrival date & time: 05/25/18  1340     History   Chief Complaint Chief Complaint  Patient presents with  . Vaginal Discharge    HPI Sabrina Buckley is a 32 y.o. female.   [facility] of having a yeast infection. The patient stated that she noticed some moderate itching and some discharge on her tampon.  Patient has a new sexual partner.  She had intercourse on Monday.  Her period began on Wednesday.  Patient works as a LawyerCNA.     Past Medical History:  Diagnosis Date  . Abdominal adhesions    see 10/16/2016 op note  . Anemia   . Chlamydia   . H/O Fitz-Hugh-Curtis syndrome   . SVD (spontaneous vaginal delivery) x 2  . Vaginal Pap smear, abnormal   . Ventral hernia     Patient Active Problem List   Diagnosis Date Noted  . Carbuncle 04/27/2018  . Right foot pain 01/01/2018  . Lateral epicondylitis, left elbow 03/13/2017  . Secondary amenorrhea 11/01/2016  . Anemia 10/16/2016  . OBESITY, UNSPECIFIED 12/14/2008  . GERD 08/04/2008  . TOBACCO DEPENDENCE 09/05/2006    Past Surgical History:  Procedure Laterality Date  . CESAREAN SECTION     x 1  . HERNIA REPAIR    . LAPAROSCOPIC TUBAL LIGATION Bilateral 10/16/2016   Procedure: LAPAROSCOPIC TUBAL LIGATION;  Surgeon: Ridgeway Bingharlie Pickens, MD;  Location: [facility];  Service: Gynecology;  Laterality: Bilateral;  . LAPAROSCOPY  10/16/2016   Procedure: LAPAROSCOPY DIAGNOSTIC;  Surgeon: Leeton Bingharlie Pickens, MD;  Location: [facility];  Service: Gynecology;;  . UMBILICAL HERNIA REPAIR N/A 2013   with mesh    OB History    Gravida  4   Para  3   Term  2   Preterm  1   AB  1   Living  3     SAB  1   TAB      Ectopic      Multiple  0   Live Births  3            Home Medications    Prior to Admission medications   Medication Sig Start Date End Date Taking? Authorizing Provider  ibuprofen (ADVIL,MOTRIN) 800 MG tablet Take 1  tablet (800 mg total) by mouth every 8 (eight) hours as needed. 03/21/17  Yes Arvilla MarketWallace, Catherine Lauren, DO  fluconazole (DIFLUCAN) 150 MG tablet Take 1 tablet (150 mg total) by mouth once for 1 dose. Repeat if needed 05/25/18 05/25/18  Elvina SidleLauenstein, Lawana Hartzell, MD    Family History History reviewed. No pertinent family history.  Social History Social History   Tobacco Use  . Smoking status: Current Every Day Smoker    Packs/day: 0.50    Years: 10.00    Pack years: 5.00    Types: Cigarettes  . Smokeless tobacco: Never Used  Substance Use Topics  . Alcohol use: No  . Drug use: No     Allergies   Patient has no known allergies.   Review of Systems Review of Systems   Physical Exam Triage Vital Signs ED Triage Vitals  Enc Vitals Group     BP 05/25/18 1405 111/80     Pulse Rate 05/25/18 1405 85     Resp 05/25/18 1405 18     Temp 05/25/18 1405 97.9 F (36.6 C)     Temp Source 05/25/18 1405 Oral  SpO2 05/25/18 1405 100 %     Weight --      Height --      Head Circumference --      Peak Flow --      Pain Score 05/25/18 1403 6     Pain Loc --      Pain Edu? --      Excl. in GC? --    No data found.  Updated Vital Signs BP 111/80 (BP Location: Left Arm)   Pulse 85   Temp 97.9 F (36.6 C) (Oral)   Resp 18   LMP 05/25/2018   SpO2 100%    Physical Exam  Constitutional: She is oriented to person, place, and time. She appears well-developed and well-nourished.  HENT:  Right Ear: External ear normal.  Left Ear: External ear normal.  Mouth/Throat: Oropharynx is clear and moist.  Eyes: Pupils are equal, round, and reactive to light. Conjunctivae are normal.  Neck: Normal range of motion. Neck supple.  Pulmonary/Chest: Effort normal.  Musculoskeletal: Normal range of motion.  Neurological: She is alert and oriented to person, place, and time.  Skin: Skin is warm and dry.  Psychiatric: She has a normal mood and affect. Her behavior is normal. Thought content normal.    Nursing note and vitals reviewed.    UC Treatments / Results  Labs (all labs ordered are listed, but only abnormal results are displayed) Labs Reviewed  CERVICOVAGINAL ANCILLARY ONLY    EKG None  Radiology No results found.  Procedures Procedures (including critical care time)  Medications Ordered in UC Medications - No data to display  Initial Impression / Assessment and Plan / UC Course  I have reviewed the triage vital signs and the nursing notes.  Pertinent labs & imaging results that were available during my care of the patient were reviewed by me and considered in my medical decision making (see chart for details).     Final Clinical Impressions(s) / UC Diagnoses   Final diagnoses:  Acute vaginitis   Discharge Instructions   None    ED Prescriptions    Medication Sig Dispense Auth. Provider   fluconazole (DIFLUCAN) 150 MG tablet Take 1 tablet (150 mg total) by mouth once for 1 dose. Repeat if needed 2 tablet Elvina Sidle, MD     Controlled Substance Prescriptions [place] Controlled Substance Registry consulted? No   Elvina Sidle, MD 05/25/18 1425

## 2018-05-25 NOTE — ED Triage Notes (Signed)
[facility] a [facility]. The patient stated that she noticed some moderate itching and some discharge on her tampon.

## 2018-05-26 LAB — CERVICOVAGINAL ANCILLARY ONLY
Bacterial vaginitis: NEGATIVE
Chlamydia: NEGATIVE
Neisseria Gonorrhea: NEGATIVE
Trichomonas: NEGATIVE

## 2018-07-30 IMAGING — US US MFM OB FOLLOW-UP
1 series · 13 of 28 positions shown · non-contrast
Comparison: none

[Series 1: us mfm ob follow-up · 51 acquisitions, 13 frames shown]
[im 2/51]
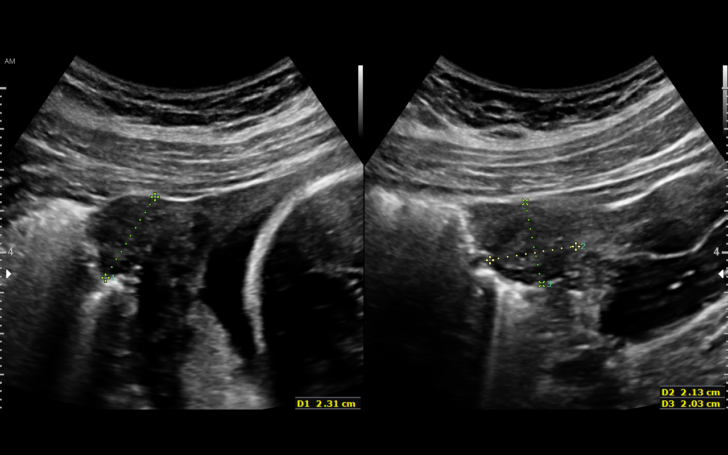
[im 6/51]
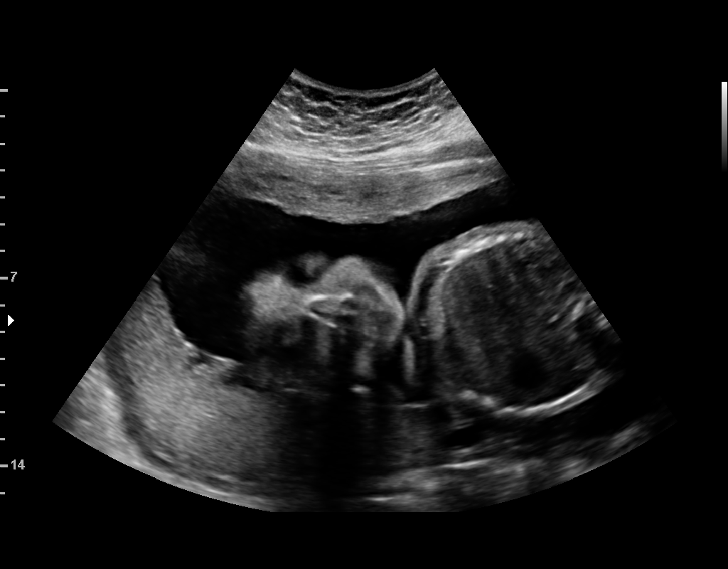
[im 10/51]
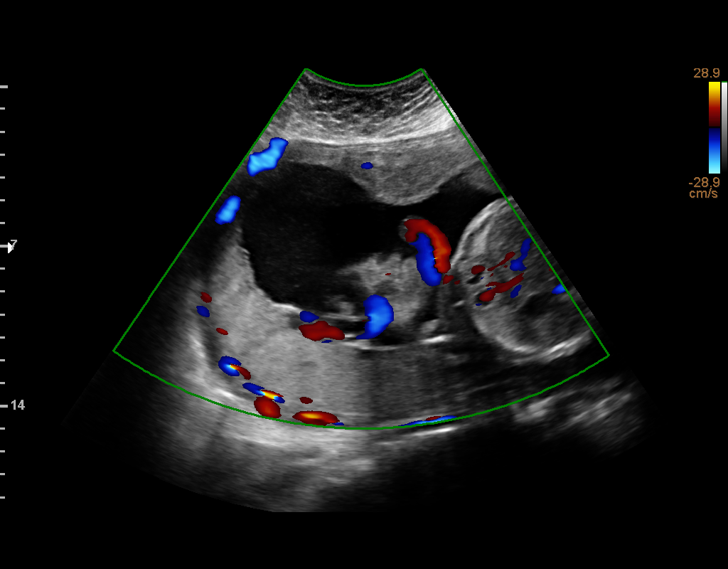
[im 13/51]
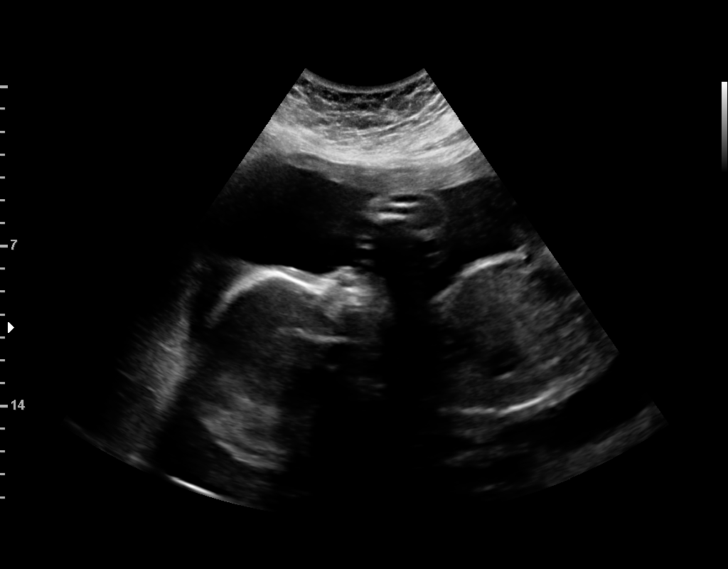
[im 17/51]
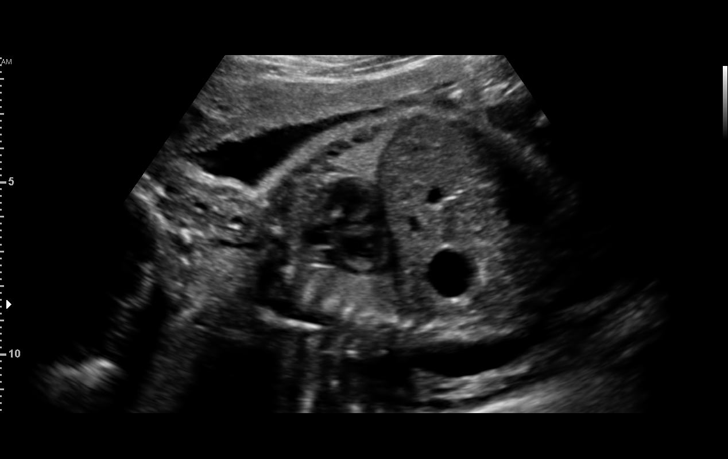
[im 21/51]
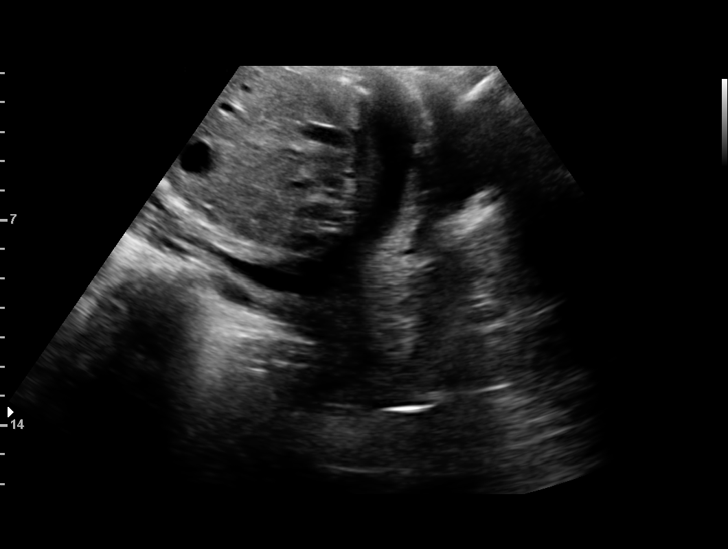
[im 26/51]
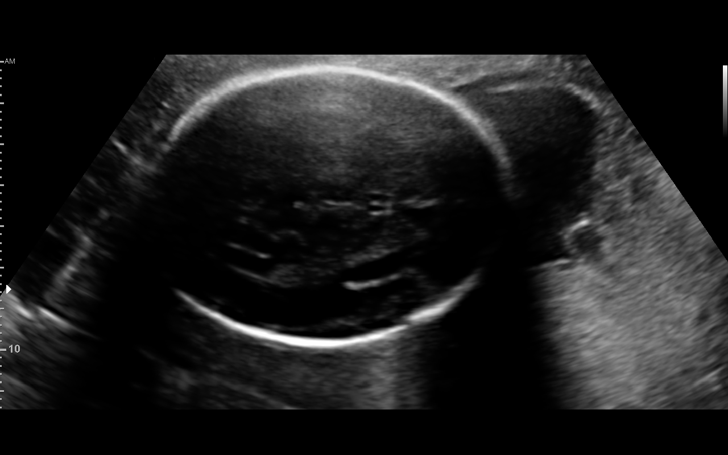
[im 30/51]
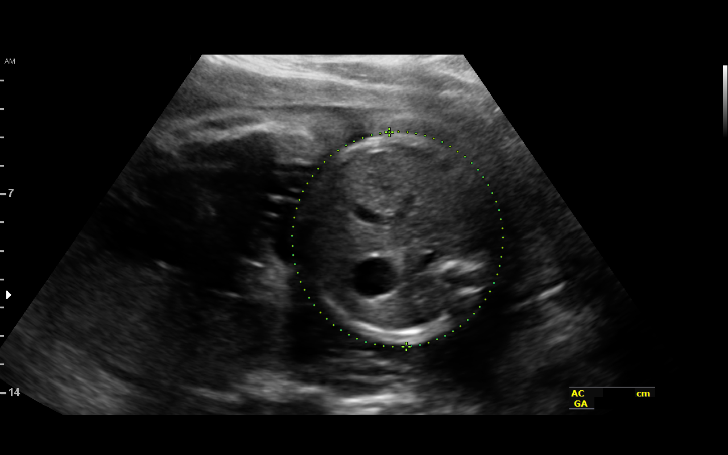
[im 34/51]
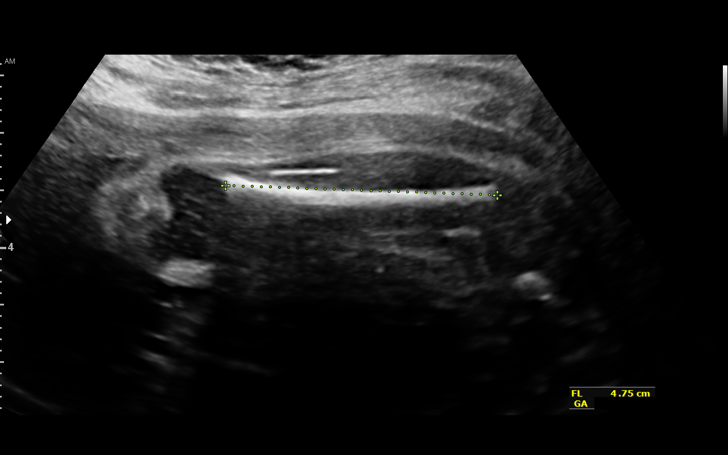
[im 38/51]
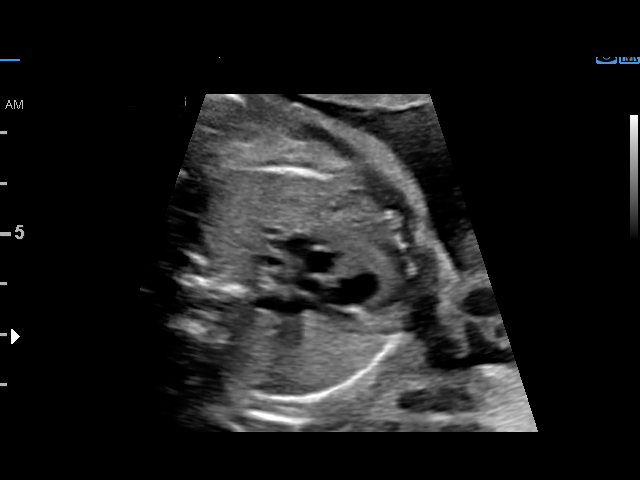
[im 41/51]
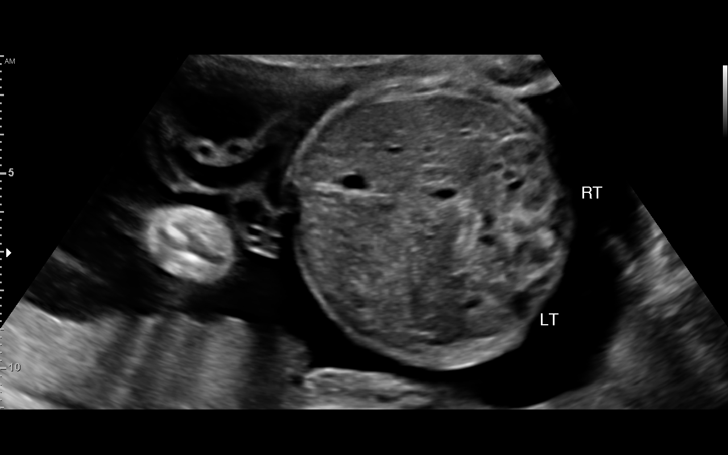
[im 45/51]
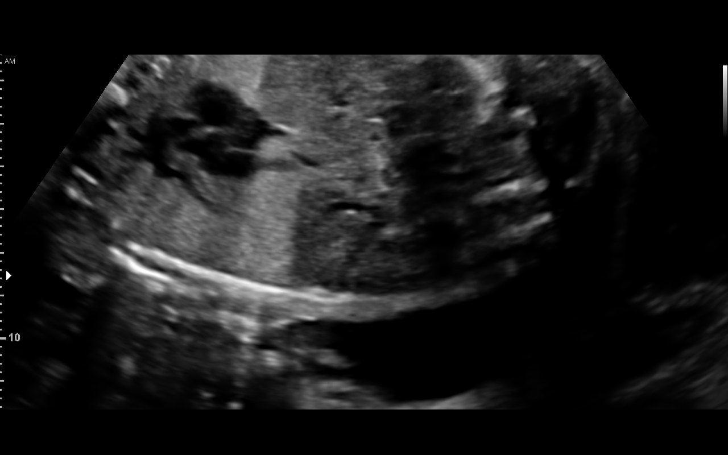
[im 49/51]
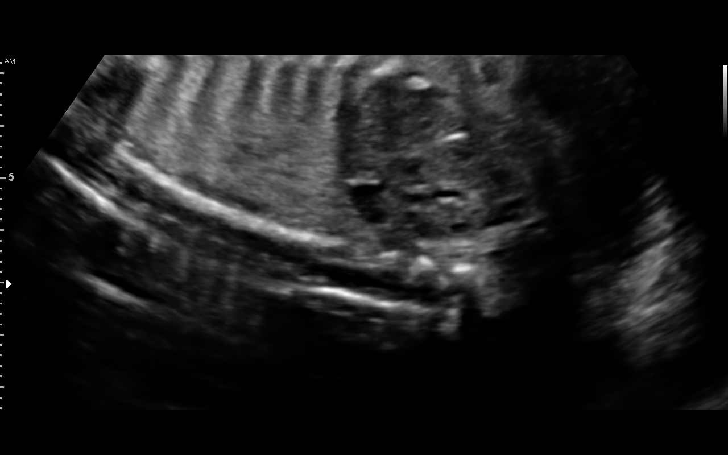

[13 of 28 positions shown; findings below may reference images not displayed]

LUIGY

1  ITELVINA                 [id number]      [id number]     [id number]
JHEMBOY
Indications

26 weeks gestation of pregnancy
Obesity complicating pregnancy, second
trimester
Encounter for other antenatal screening
follow-up
History of cesarean delivery, currently
pregnant
Poor obstetric history: Previous preterm
delivery, antepartum
OB History

Blood Type:            Height:  5'3"   Weight (lb):  193       BMI:
Fetal Evaluation

Num Of Fetuses:     1
Fetal Heart         150
Rate(bpm):
Cardiac Activity:   Observed
Presentation:       Breech
Placenta:           Posterior, above cervical os
P. Cord Insertion:  Visualized, central

Amniotic Fluid
AFI FV:      Subjectively within normal limits

Largest Pocket(cm)
5.17
Biometry

BPD:      66.4  mm     G. Age:  26w 5d         36  %    CI:        73.08   %    70 - 86
FL/HC:      19.4   %    18.6 -
HC:      246.9  mm     G. Age:  26w 6d         22  %    HC/AC:      1.05        1.05 -
AC:      234.3  mm     G. Age:  27w 5d         69  %    FL/BPD:     72.1   %    71 - 87
FL:       47.9  mm     G. Age:  26w 0d         15  %    FL/AC:      20.4   %    20 - 24

Est. FW:    0909  gm      2 lb 4 oz     56  %
Gestational Age

LMP:           26w 6d        Date:  10/25/15                 EDD:   07/31/16
U/S Today:     26w 6d                                        EDD:   07/31/16
Best:          26w 6d     Det. By:  LMP  (10/25/15)          EDD:   07/31/16
Anatomy

Cranium:               Appears normal         Aortic Arch:            Previously seen
Cavum:                 Appears normal         Ductal Arch:            Previously seen
Ventricles:            Appears normal         Diaphragm:              Appears normal
Choroid Plexus:        Previously seen        Stomach:                Appears normal, left
sided
Cerebellum:            Previously seen        Abdomen:                Appears normal
Posterior Fossa:       Previously seen        Abdominal Wall:         Previously seen
Nuchal Fold:           Not applicable (>20    Cord Vessels:           Previously seen
wks GA)
Face:                  Orbits and profile     Kidneys:                Appear normal
previously seen
Lips:                  Appears normal         Bladder:                Appears normal
Thoracic:              Appears normal         Spine:                  Previously seen
Heart:                 Appears normal         Upper Extremities:      Previously seen
(4CH, axis, and situs
RVOT:                  Appears normal         Lower Extremities:      Previously seen
LVOT:                  Appears normal

Other:  Fetus appears to be a female. Heels and rt 5th previously visualized.
Cervix Uterus Adnexa

Cervix
Length:           3.32  cm.
Normal appearance by transabdominal scan.

Uterus
No abnormality visualized.

Left Ovary
Not visualized.

Right Ovary
Within normal limits.

Adnexa:       No abnormality visualized.
Impression

Singleton intrauterine pregnancy at 26+6 weeks
Review of the anatomy shows no sonographic markers for
aneuploidy or structural anomalies
cervical length 33mm transabdominally
Amniotic fluid volume is normal
Estimated fetal weight is 1014g which is growth in the 56th
percentile
Recommendations

Growth, development and cervical length are normal. Follow-
up ultrasounds as clinically indicated.

## 2018-08-30 ENCOUNTER — Ambulatory Visit (HOSPITAL_COMMUNITY)
Admission: EM | Admit: 2018-08-30 | Discharge: 2018-08-30 | Disposition: A | Discharge status: Home / Self Care | Payer: BLUE CROSS/BLUE SHIELD | Attending: Family Medicine | Admitting: Family Medicine

## 2018-08-30 ENCOUNTER — Encounter (HOSPITAL_COMMUNITY): Payer: Self-pay | Admitting: Emergency Medicine

## 2018-08-30 DIAGNOSIS — K029 Dental caries, unspecified: Secondary | ICD-10-CM | POA: Diagnosis not present

## 2018-08-30 DIAGNOSIS — K047 Periapical abscess without sinus: Secondary | ICD-10-CM | POA: Diagnosis not present

## 2018-08-30 MED ORDER — HYDROCODONE-ACETAMINOPHEN 5-325 MG PO TABS: 1.0000 | ORAL_TABLET | Freq: Four times a day (QID) | ORAL | 0 refills | Status: DC | PRN

## 2018-08-30 MED ORDER — PENICILLIN V POTASSIUM 500 MG PO TABS: 500.0000 mg | ORAL_TABLET | Freq: Three times a day (TID) | ORAL | 0 refills | Status: DC

## 2018-08-30 NOTE — ED Provider Notes (Signed)
[facility]    CSN: [id number] Arrival date & time: 08/30/18  1615     History   Chief Complaint Chief Complaint  Patient presents with  . Dental Pain    HPI Sabrina Buckley is a 33 y.o. female.   This is a 32 year old woman who is an established patient here at [facility] care.  She comes in complaining of left upper molar dental pain.  Dental pain in this tooth for months off and on.  Just got worse last day or so.  Patient works as a Lawyer.  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       Past Medical History:  Diagnosis Date  . Abdominal adhesions    see 10/16/2016 op note  . Anemia   . Chlamydia   . H/O Fitz-Hugh-Curtis syndrome   . SVD (spontaneous vaginal delivery) x 2  . Vaginal Pap smear, abnormal   . Ventral hernia     Patient Active Problem List   Diagnosis Date Noted  . Carbuncle 04/27/2018  . Right foot pain 01/01/2018  . Lateral epicondylitis, left elbow 03/13/2017  . Secondary amenorrhea 11/01/2016  . Anemia 10/16/2016  . OBESITY, UNSPECIFIED 12/14/2008  . GERD 08/04/2008  . TOBACCO DEPENDENCE 09/05/2006    Past Surgical History:  Procedure Laterality Date  . CESAREAN SECTION     x 1  . HERNIA REPAIR    . LAPAROSCOPIC TUBAL LIGATION Bilateral 10/16/2016   Procedure: LAPAROSCOPIC TUBAL LIGATION;  Surgeon: Florence Bing, MD;  Location: [facility];  Service: Gynecology;  Laterality: Bilateral;  . LAPAROSCOPY  10/16/2016   Procedure: LAPAROSCOPY DIAGNOSTIC;  Surgeon: [place] Bing, MD;  Location: [facility];  Service: Gynecology;;  . UMBILICAL HERNIA REPAIR N/A 2013   with mesh    OB History    Gravida  4   Para  3   Term  2   Preterm  1   AB  1   Living  3     SAB  1   TAB      Ectopic      Multiple  0   Live Births  3            Home Medications    Prior to Admission medications   Medication Sig Start Date End Date Taking? Authorizing Provider    HYDROcodone-acetaminophen (NORCO) 5-325 MG tablet Take 1 tablet by mouth every 6 (six) hours as needed for moderate pain. 08/30/18   Elvina Sidle, MD  ibuprofen (ADVIL,MOTRIN) 800 MG tablet Take 1 tablet (800 mg total) by mouth every 8 (eight) hours as needed. 03/21/17   Arvilla Market, DO  penicillin v potassium (VEETID) 500 MG tablet Take 1 tablet (500 mg total) by mouth 3 (three) times daily. 08/30/18   Elvina Sidle, MD    Family History History reviewed. No pertinent family history.  Social History Social History   Tobacco Use  . Smoking status: Current Every Day Smoker    Packs/day: 0.50    Years: 10.00    Pack years: 5.00    Types: Cigarettes  . Smokeless tobacco: Never Used  Substance Use Topics  . Alcohol use: No  . Drug use: No     Allergies   Patient has no known allergies.   Review of Systems Review of Systems   Physical Exam Triage Vital Signs ED Triage Vitals [08/30/18 1634]  Enc Vitals Group     BP (!) 165/81  Pulse Rate (!) 58     Resp 18     Temp 97.9 F (36.6 C)     Temp Source Temporal     SpO2 100 %     Weight      Height      Head Circumference      Peak Flow      Pain Score 9     Pain Loc      Pain Edu?      Excl. in GC?    No data found.  Updated Vital Signs BP (!) 165/81 (BP Location: Right Arm)   Pulse (!) 58   Temp 97.9 F (36.6 C) (Temporal)   Resp 18   SpO2 100%    Physical Exam Vitals signs and nursing note reviewed.  Constitutional:      Appearance: Normal appearance. She is obese.  HENT:     Head: Normocephalic and atraumatic.     Mouth/Throat:     Mouth: Mucous membranes are moist.     Comments: Dental carry in tooth #15 Eyes:     Conjunctiva/sclera: Conjunctivae normal.  Neck:     Musculoskeletal: Normal range of motion and neck supple.  Pulmonary:     Effort: Pulmonary effort is normal.  Musculoskeletal: Normal range of motion.  Lymphadenopathy:     Cervical: No cervical adenopathy.   Skin:    General: Skin is warm.  Neurological:     General: No focal deficit present.     Mental Status: She is alert.  Psychiatric:        Mood and Affect: Mood normal.      UC Treatments / Results  Labs (all labs ordered are listed, but only abnormal results are displayed) Labs Reviewed - No data to display  EKG None  Radiology No results found.  Procedures Procedures (including critical care time)  Medications Ordered in UC Medications - No data to display  Initial Impression / Assessment and Plan / UC Course  I have reviewed the triage vital signs and the nursing notes.  Pertinent labs & imaging results that were available during my care of the patient were reviewed by me and considered in my medical decision making (see chart for details).    Final Clinical Impressions(s) / UC Diagnoses   Final diagnoses:  Dental caries  Dental infection   Discharge Instructions   None    ED Prescriptions    Medication Sig Dispense Auth. Provider   HYDROcodone-acetaminophen (NORCO) 5-325 MG tablet Take 1 tablet by mouth every 6 (six) hours as needed for moderate pain. 12 tablet Elvina Sidle, MD   penicillin v potassium (VEETID) 500 MG tablet Take 1 tablet (500 mg total) by mouth 3 (three) times daily. 30 tablet Elvina Sidle, MD     Controlled Substance Prescriptions [place] Controlled Substance Registry consulted? Not Applicable   Elvina Sidle, MD 08/30/18 [phone]

## 2018-08-30 NOTE — ED Triage Notes (Signed)
Pt sts left upper dental pain 

## 2018-10-28 ENCOUNTER — Other Ambulatory Visit: Payer: Self-pay

## 2018-10-28 ENCOUNTER — Telehealth (INDEPENDENT_AMBULATORY_CARE_PROVIDER_SITE_OTHER): Payer: BLUE CROSS/BLUE SHIELD | Admitting: Family Medicine

## 2018-10-28 DIAGNOSIS — B373 Candidiasis of vulva and vagina: Secondary | ICD-10-CM

## 2018-10-28 DIAGNOSIS — B3731 Acute candidiasis of vulva and vagina: Secondary | ICD-10-CM

## 2018-10-28 MED ORDER — FLUCONAZOLE 150 MG PO TABS: 150.0000 mg | ORAL_TABLET | Freq: Once | ORAL | 0 refills | Status: AC

## 2018-10-28 NOTE — Progress Notes (Signed)
[facility] Telemedicine Visit  Patient consented to have virtual visit. Method of visit: Telephone  Encounter participants: Patient: Sabrina Buckley - located at work Provider: Renold Don - located at office Others (if applicable): n/a   Chief Complaint: vaginal discomfort  HPI:  Patient complaining of several days of vaginal discomfort.  Started on Saturday.  She noticed some thick white vaginal discharge.  She has a history of yeast infections, although none recently, states this feels like that.  Also has had some swelling of her labia.  Some itching present.  Did engage in sexual intercourse on Friday and Saturday with same partner and symptoms started several hours afterwards.  No fevers or chills.  No dysuria.  No urinary frequency or hesitancy.  Abdominal pain.  ROS: per HPI  Pertinent PMHx: Obesity.  Exam: Gen:  Patient awake, alert, fully conversant, oriented x 4 Auditory:  Hearing normal Resp:  Good strong voice.  Speaking in full sentences.  No coughing during exam.  No respiratory distress.  No audible wheezing over the phone  Psych:  Linear and coherent thought process.  No flight of ideas.    Assessment/Plan:  1.  Yeast vaginitis: -Most likely diagnosis based on symptoms. -We will treat with Diflucan. -If no improvement within the next week she will need to be seen here for possible wet prep. -No red flags.  Time spent on phone with patient: 8 minutes

## 2019-02-28 ENCOUNTER — Other Ambulatory Visit: Payer: Self-pay

## 2019-02-28 ENCOUNTER — Ambulatory Visit (HOSPITAL_COMMUNITY)
Admission: EM | Admit: 2019-02-28 | Discharge: 2019-02-28 | Disposition: A | Discharge status: Home / Self Care | Payer: BC Managed Care – PPO | Attending: Family Medicine | Admitting: Family Medicine

## 2019-02-28 ENCOUNTER — Encounter (HOSPITAL_COMMUNITY): Payer: Self-pay

## 2019-02-28 DIAGNOSIS — R1310 Dysphagia, unspecified: Secondary | ICD-10-CM

## 2019-02-28 DIAGNOSIS — K219 Gastro-esophageal reflux disease without esophagitis: Secondary | ICD-10-CM

## 2019-02-28 MED ORDER — FAMOTIDINE 40 MG PO TABS: 40.0000 mg | ORAL_TABLET | Freq: Two times a day (BID) | ORAL | 1 refills | Status: AC

## 2019-02-28 NOTE — ED Triage Notes (Signed)
Patient presents to Urgent Care with complaints of possible GERD since 5 days ago. Patient reports when she swallows, it feels like the bottom of her throat tightens up. pt states it does not feel like strep throat, denies other respiratory symptoms, pt still has tonsils, has not looked in the back of her throat herself.

## 2019-02-28 NOTE — Discharge Instructions (Addendum)
Follow-up with PCP to request further evaluation of symptoms. You may benefit from a referral to Gastroenterologist.  Review list of foods to avoid and reduce while symptoms are present.

## 2019-02-28 NOTE — ED Provider Notes (Signed)
[facility]    CSN: [id number] Arrival date & time: 02/28/19  1447      History   Chief Complaint Chief Complaint  Patient presents with  . Gastroesophageal Reflux    HPI Sabrina Buckley is a 33 y.o. female.   HPI   Patient presents today with concerns of "food getting stuck in throat". This has been occurring for over the last several weeks. No recent changes in diet or known history of acid reflux. Throat is non-painful. Tolerating fluids fine. Endorses epigastric burning and belching which is worst with in lying down. She endorses a poor diet high in spicy and fried foods. She is daily smoker. She has not attempted relief of symptoms with any OTC medications. Reports normal bowel patterns. No unintentional weight loss or changes in appetite. Past Medical History:  Diagnosis Date  . Abdominal adhesions    see 10/16/2016 op note  . Anemia   . Chlamydia   . H/O Fitz-Hugh-Curtis syndrome   . SVD (spontaneous vaginal delivery) x 2  . Vaginal Pap smear, abnormal   . Ventral hernia     Patient Active Problem List   Diagnosis Date Noted  . Carbuncle 04/27/2018  . Right foot pain 01/01/2018  . Lateral epicondylitis, left elbow 03/13/2017  . Secondary amenorrhea 11/01/2016  . Anemia 10/16/2016  . OBESITY, UNSPECIFIED 12/14/2008  . GERD 08/04/2008  . TOBACCO DEPENDENCE 09/05/2006    Past Surgical History:  Procedure Laterality Date  . CESAREAN SECTION     x 1  . HERNIA REPAIR    . LAPAROSCOPIC TUBAL LIGATION Bilateral 10/16/2016   Procedure: LAPAROSCOPIC TUBAL LIGATION;  Surgeon: Aletha Halim, MD;  Location: [facility];  Service: Gynecology;  Laterality: Bilateral;  . LAPAROSCOPY  10/16/2016   Procedure: LAPAROSCOPY DIAGNOSTIC;  Surgeon: Aletha Halim, MD;  Location: [facility];  Service: Gynecology;;  . UMBILICAL HERNIA REPAIR N/A 2013   with mesh    OB History    Gravida  4   Para  3   Term  2   Preterm  1   AB  1   Living  3     SAB  1    TAB      Ectopic      Multiple  0   Live Births  3            Home Medications    Prior to Admission medications   Medication Sig Start Date End Date Taking? Authorizing Provider  HYDROcodone-acetaminophen (NORCO) 5-325 MG tablet Take 1 tablet by mouth every 6 (six) hours as needed for moderate pain. 08/30/18   Robyn Haber, MD  ibuprofen (ADVIL,MOTRIN) 800 MG tablet Take 1 tablet (800 mg total) by mouth every 8 (eight) hours as needed. 03/21/17   Nicolette Bang, DO  penicillin v potassium (VEETID) 500 MG tablet Take 1 tablet (500 mg total) by mouth 3 (three) times daily. 08/30/18   Robyn Haber, MD    Family History Family History  Problem Relation Age of Onset  . Healthy Mother   . Healthy Father     Social History Social History   Tobacco Use  . Smoking status: Current Every Day Smoker    Packs/day: 0.25    Years: 10.00    Pack years: 2.50    Types: Cigarettes  . Smokeless tobacco: Never Used  Substance Use Topics  . Alcohol use: No  . Drug use: No     Allergies   Patient has no  known allergies.   Review of Systems Review of Systems Pertinent negatives listed in HPI  Physical Exam Triage Vital Signs ED Triage Vitals  Enc Vitals Group     BP 02/28/19 1512 112/79     Pulse Rate 02/28/19 1512 91     Resp 02/28/19 1512 14     Temp 02/28/19 1512 98.5 F (36.9 C)     Temp Source 02/28/19 1512 Oral     SpO2 02/28/19 1512 98 %     Weight --      Height --      Head Circumference --      Peak Flow --      Pain Score 02/28/19 1511 2     Pain Loc --      Pain Edu? --      Excl. in GC? --    No data found.  Updated Vital Signs BP 112/79 (BP Location: Right Arm)   Pulse 91   Temp 98.5 F (36.9 C) (Oral)   Resp 14   LMP 05/30/2018 Comment: tubal ligation  SpO2 98%   Visual Acuity Right Eye Distance:   Left Eye Distance:   Bilateral Distance:    Right Eye Near:   Left Eye Near:    Bilateral Near:     Physical Exam  General appearance: alert, well developed, well nourished, cooperative and in no distress Head: Normocephalic, without obvious abnormality, atraumatic Negative cervical adenopathy and airway open.    Respiratory: Respirations even and unlabored, normal respiratory rate Heart: rate and rhythm normal. No gallop or murmurs noted on exam  Abdomen: BS +, no distention, no rebound tenderness, or no mass Extremities: No gross deformities Skin: Skin color, texture, turgor normal. No rashes seen  Psych: Appropriate mood and affect. Neurologic: Mental status: Alert, oriented to person, place, and time, thought content appropriate.  UC Treatments / Results  Labs (all labs ordered are listed, but only abnormal results are displayed) Labs Reviewed - No data to display  EKG   Radiology No results found.  Procedures Procedures (including critical care time)  Medications Ordered in UC Medications - No data to display  Initial Impression / Assessment and Plan / UC Course  I have reviewed the triage vital signs and the nursing notes.  Pertinent labs & imaging results that were available during my care of the patient were reviewed by me and considered in my medical decision making (see chart for details).    Suspect symptoms of dysphagia related to underlying acid reflux. Will trial Pepcid 40 mg twice daily until symptoms resolve. Discussed foods that will further exacerbate symptoms and how smoking can worsen symptoms of acid reflux. For dysphagia symptoms, patient will further benefit from work-up by GI. Patient has a PCP and will follow-up to request GI referral. Red flags discussed. Patient verbalized understanding and agreement with plan. Final Clinical Impressions(s) / UC Diagnoses   Final diagnoses:  Dysphagia, unspecified type  Gastroesophageal reflux disease, esophagitis presence not specified     Discharge Instructions     Follow-up with PCP to request further evaluation of  symptoms. You may benefit from a referral to Gastroenterologist.  Review list of foods to avoid and reduce while symptoms are present.    ED Prescriptions    Medication Sig Dispense Auth. Provider   famotidine (PEPCID) 40 MG tablet Take 1 tablet (40 mg total) by mouth 2 (two) times daily. 30 tablet Bing NeighborsHarris, Justan Gaede S, FNP     Controlled Substance Prescriptions [place] Controlled  Substance Registry consulted? Not Applicable   Bing NeighborsHarris, Kassius Battiste S, FNP 03/01/19 1843

## 2019-03-02 ENCOUNTER — Other Ambulatory Visit (HOSPITAL_COMMUNITY)
Admission: RE | Admit: 2019-03-02 | Discharge: 2019-03-02 | Disposition: A | Discharge status: Home / Self Care | Payer: BC Managed Care – PPO | Source: Ambulatory Visit | Attending: Family Medicine | Admitting: Family Medicine

## 2019-03-02 ENCOUNTER — Ambulatory Visit (INDEPENDENT_AMBULATORY_CARE_PROVIDER_SITE_OTHER): Payer: BC Managed Care – PPO | Admitting: Family Medicine

## 2019-03-02 ENCOUNTER — Other Ambulatory Visit: Payer: Self-pay

## 2019-03-02 ENCOUNTER — Encounter: Payer: Self-pay | Admitting: Family Medicine

## 2019-03-02 VITALS — BP 118/64 | HR 72 | Wt 206.4 lb

## 2019-03-02 DIAGNOSIS — N898 Other specified noninflammatory disorders of vagina: Secondary | ICD-10-CM | POA: Insufficient documentation

## 2019-03-02 DIAGNOSIS — N911 Secondary amenorrhea: Secondary | ICD-10-CM

## 2019-03-02 DIAGNOSIS — F17200 Nicotine dependence, unspecified, uncomplicated: Secondary | ICD-10-CM

## 2019-03-02 LAB — POCT WET PREP (WET MOUNT)
Clue Cells Wet Prep Whiff POC: POSITIVE
Trichomonas Wet Prep HPF POC: ABSENT

## 2019-03-02 LAB — POCT URINE PREGNANCY: Preg Test, Ur: NEGATIVE

## 2019-03-02 MED ORDER — METRONIDAZOLE 500 MG PO TABS: 500.0000 mg | ORAL_TABLET | Freq: Two times a day (BID) | ORAL | 0 refills | Status: AC

## 2019-03-02 NOTE — Progress Notes (Signed)
   [facility] Phone: [phone]     Sabrina Buckley - 33 y.o. female MRN [id number]  Date of birth: 1986-06-24  Subjective:   cc: vaginal discharge, odor.   HPI:  Vaginal discharge/odor: she says she has more discharge than normal and it has a 'fishy' smell to it.  This has been happening for one week. She denes pruritus/vaginal pain/abdominal pain/n/v.  She had sexual intercourse with a new partner two weeks ago.  She did not use protection.  Last time she had sexual intercourse was a month ago with another partner.  She did not use condoms during this encounter.   Amenorrhea - November 2019 was last time she had a menstrual cycle.  Spotted on the 19th of this month that occurred once while she was using the bathroom. she Had a tubal ligation a few years ago. her Period used to come every month until it stopped in November.  No family history of early menopause.  She had hot flashes last night but had not had any episodes prior to taht. .    ROS: See HPI for pertinent positives and negatives  Past Medical History  Family history reviewed for today's visit. No changes.  Social history- patient is a current smoker.  Half a pack per day.  Interested in tyring to quit.  Has tried before without nicotine replacement therapy.      Health Maintenance:  -  Health Maintenance Due  Topic Date Due  . INFLUENZA VACCINE  02/07/2019   Objective:   BP 118/64   Pulse 72   Wt 206 lb 6.4 oz (93.6 kg)   SpO2 98%   BMI 36.56 kg/m  Gen: NAD, alert and oriented, cooperative with exam CV: normal rate, regular rhythm. No murmurs, no rubs.  Resp: LCTAB, no wheezes, crackles. normal work of breathing GI: nontender to palpation, BS present, no guarding or organomegaly GU: minimal amount of whitish discharge. No lesions seen.  No cervical motion tenderness.   Assessment/Plan:   No problem-specific Assessment & Plan notes found for this encounter.   Orders Placed  This Encounter  Procedures  . Follicle stimulating hormone  . Prolactin  . TSH  . HIV Antibody (routine testing w rflx)  . RPR  . POCT Wet Prep [employer])  . POCT urine pregnancy    Meds ordered this encounter  Medications  . metroNIDAZOLE (FLAGYL) 500 MG tablet    Sig: Take 1 tablet (500 mg total) by mouth 2 (two) times daily for 7 days.    Dispense:  14 tablet    Refill:  0     Clemetine Marker, MD PGY-2 [facility]

## 2019-03-02 NOTE — Patient Instructions (Signed)
It is nice to see you today,  I will call you with the results of this test when I get it.  I have also gotten some blood work to work-up the cause of your lack of menstrual periods.  I would advise you to use condoms when having sexual intercourse with new partners in the future.  In addition to things like gonorrhea and chlamydia, it puts you at risk for more serious diseases such as HIV when you do not use condoms.  If there is an antibiotic we need to prescribe based on the tests, I will call you to let you know when you can pick it up.  Have a great day,  Clemetine Marker, MD.

## 2019-03-03 LAB — TSH: TSH: 0.758 u[IU]/mL (ref 0.450–4.500)

## 2019-03-03 LAB — FOLLICLE STIMULATING HORMONE: FSH: 9.1 m[IU]/mL

## 2019-03-03 LAB — HIV ANTIBODY (ROUTINE TESTING W REFLEX): HIV Screen 4th Generation wRfx: NONREACTIVE

## 2019-03-03 LAB — PROLACTIN: Prolactin: 7.7 ng/mL (ref 4.8–23.3)

## 2019-03-03 LAB — RPR: RPR Ser Ql: NONREACTIVE

## 2019-03-04 DIAGNOSIS — N898 Other specified noninflammatory disorders of vagina: Secondary | ICD-10-CM | POA: Insufficient documentation

## 2019-03-04 LAB — CERVICOVAGINAL ANCILLARY ONLY
Chlamydia: NEGATIVE
Neisseria Gonorrhea: NEGATIVE

## 2019-03-04 MED ORDER — NICOTINE 14 MG/24HR TD PT24: 14.0000 mg | MEDICATED_PATCH | Freq: Every day | TRANSDERMAL | 1 refills | Status: DC

## 2019-03-04 NOTE — Assessment & Plan Note (Signed)
No menstrual cycle in past 9 months.   - tsh, prl, fsh - pregnancy test - follow up with pcp

## 2019-03-04 NOTE — Assessment & Plan Note (Signed)
Desires to quit.  Half pack per day habit'-  - smoking cessation counseling - nicotine replacement therapy

## 2019-03-04 NOTE — Assessment & Plan Note (Signed)
-   wet prep, rpr, hiv, gc/ct - advised pt on condom use and safe sex practices.

## 2019-03-13 ENCOUNTER — Ambulatory Visit (INDEPENDENT_AMBULATORY_CARE_PROVIDER_SITE_OTHER): Payer: BC Managed Care – PPO | Admitting: Family Medicine

## 2019-03-13 ENCOUNTER — Other Ambulatory Visit: Payer: Self-pay

## 2019-03-13 ENCOUNTER — Encounter: Payer: Self-pay | Admitting: Family Medicine

## 2019-03-13 VITALS — Ht 63.0 in | Wt 206.0 lb

## 2019-03-13 DIAGNOSIS — N911 Secondary amenorrhea: Secondary | ICD-10-CM

## 2019-03-13 DIAGNOSIS — Z23 Encounter for immunization: Secondary | ICD-10-CM

## 2019-03-13 DIAGNOSIS — F17200 Nicotine dependence, unspecified, uncomplicated: Secondary | ICD-10-CM

## 2019-03-13 MED ORDER — MEDROXYPROGESTERONE ACETATE 10 MG PO TABS: 10.0000 mg | ORAL_TABLET | Freq: Every day | ORAL | 0 refills | Status: DC

## 2019-03-13 NOTE — Progress Notes (Signed)
    Subjective:  Sabrina Buckley is a 33 y.o. female who presents to the [facility] today for routine clinic visit.   HPI:  Smoking cessation She is seen in clinic on 8/24 at which time she was encouraged to quit smoking.  Since then, she has been on 14 mg nicotine patch.  She has cut back on her smoking from roughly 7 cigarettes daily to 1 cigarette daily.  She is encouraged by her progress reports to like to continue as transfer that she can eventually quit smoking.  She would like to quit smoking for the benefit of her children.  Nutrition She reports that she frequently eats fast food and food that she recognizes is not ideal nutrition.  She is aware that she could eat better.  She would like to know more about healthy food options.  Exercise She reports that she currently does not partake in any kind of daily exercise.  Amenorrhea She reports that she has not had a period about 9 months.  She had multiple labs drawn during her recent visit.  She would like to know the results in the next steps.  She has had a bilateral tubal ligation.  Chief Complaint noted Review of Symptoms - see HPI PMH - Smoking status noted.    Objective:  Physical Exam: Ht 5\' 3"  (1.6 m)   Wt 206 lb (93.4 kg)   LMP 05/19/2018 (Approximate) Comment: had 2 this month and then nothing until aug 2020 with spotting  BMI 36.49 kg/m    Gen: NAD, resting comfortably CV: RRR with no murmurs appreciated Pulm: NWOB, CTAB with no crackles, wheezes, or rhonchi GI: Normal bowel sounds present. Soft, Nontender, Nondistended. MSK: no edema, cyanosis, or clubbing noted Skin: warm, dry  No results found for this or any previous visit (from the past 72 hour(s)).   Assessment/Plan:  TOBACCO DEPENDENCE -Continue nicotine patch daily -Continue reducing cigarette smoke daily  Secondary amenorrhea Initial labs of TSH, prolactin, FSH are entirely within normal limits.  There is no indication of early menopause or  endocrine abnormalities.  Will move forward with a progestin withdrawal test.   -Medroxyprogesterone 10 mg daily for 10 days, observe for withdrawal bleed -Return to clinic in about 2 weeks to discuss presence or absence of withdrawal bleed   Exercise -Encouraged at least 30 minutes of moderate exercise 5 days/week  Nutrition -Discussed my plate -Handout given

## 2019-03-13 NOTE — Assessment & Plan Note (Signed)
-  Continue nicotine patch daily -Continue reducing cigarette smoke daily

## 2019-03-13 NOTE — Assessment & Plan Note (Addendum)
Initial labs of TSH, prolactin, FSH are entirely within normal limits.  There is no indication of early menopause or endocrine abnormalities.  Will move forward with a progestin withdrawal test.   -Medroxyprogesterone 10 mg daily for 10 days, observe for withdrawal bleed -Return to clinic in about 2 weeks to discuss presence or absence of withdrawal bleed

## 2019-03-13 NOTE — Patient Instructions (Signed)
MyPlate from USDA  MyPlate is an outline of a general healthy diet based on the 2010 Dietary Guidelines for Americans, from the U.S. Department of Agriculture (USDA). It sets guidelines for how much food you should eat from each food group based on your age, sex, and level of physical activity. What are tips for following MyPlate? To follow MyPlate recommendations:  Eat a wide variety of fruits and vegetables, grains, and protein foods.  Serve smaller portions and eat less food throughout the day.  Limit portion sizes to avoid overeating.  Enjoy your food.  Get at least 150 minutes of exercise every week. This is about 30 minutes each day, 5 or more days per week. It can be difficult to have every meal look like MyPlate. Think about MyPlate as eating guidelines for an entire day, rather than each individual meal. Fruits and vegetables  Make half of your plate fruits and vegetables.  Eat many different colors of fruits and vegetables each day.  For a 2,000 calorie daily food plan, eat: ? 2 cups of vegetables every day. ? 2 cups of fruit every day.  1 cup is equal to: ? 1 cup raw or cooked vegetables. ? 1 cup raw fruit. ? 1 medium-sized orange, apple, or banana. ? 1 cup 100% fruit or vegetable juice. ? 2 cups raw leafy greens, such as lettuce, spinach, or kale. ?  cup dried fruit. Grains  One fourth of your plate should be grains.  Make at least half of the grains you eat each day whole grains.  For a 2,000 calorie daily food plan, eat 6 oz of grains every day.  1 oz is equal to: ? 1 slice bread. ? 1 cup cereal. ?  cup cooked rice, cereal, or pasta. Protein  One fourth of your plate should be protein.  Eat a wide variety of protein foods, including meat, poultry, fish, eggs, beans, nuts, and tofu.  For a 2,000 calorie daily food plan, eat 5 oz of protein every day.  1 oz is equal to: ? 1 oz meat, poultry, or fish. ?  cup cooked beans. ? 1 egg. ?  oz nuts  or seeds. ? 1 Tbsp peanut butter. Dairy  Drink fat-free or low-fat (1%) milk.  Eat or drink dairy as a side to meals.  For a 2,000 calorie daily food plan, eat or drink 3 cups of dairy every day.  1 cup is equal to: ? 1 cup milk, yogurt, cottage cheese, or soy milk (soy beverage). ? 2 oz processed cheese. ? 1 oz natural cheese. Fats, oils, salt, and sugars  Only small amounts of oils are recommended.  Avoid foods that are high in calories and low in nutritional value (empty calories), like foods high in fat or added sugars.  Choose foods that are low in salt (sodium). Choose foods that have less than 140 milligrams (mg) of sodium per serving.  Drink water instead of sugary drinks. Drink enough water each day to keep your urine pale yellow. Where to find support  Work with your health care provider or a nutrition specialist (dietitian) to develop a customized eating plan that is right for you.  Download an app (mobile application) to help you track your daily food intake. Where to find more information  Go to ChooseMyPlate.gov for more information.  Learn more and log your daily food intake according to MyPlate using USDA's SuperTracker: www.supertracker.usda.gov Summary  MyPlate is a general guideline for healthy eating from the USDA. It   is based on the 2010 Dietary Guidelines for Americans.  In general, fruits and vegetables should take up  of your plate, grains should take up  of your plate, and protein should take up  of your plate. This information is not intended to replace advice given to you by your health care provider. Make sure you discuss any questions you have with your health care provider. Document Released: 07/15/2007 Document Revised: 07/27/2017 Document Reviewed: 09/24/2016 Elsevier Patient Education  2020 Elsevier Inc.  

## 2019-03-25 NOTE — Addendum Note (Signed)
Addended by: Grant Ruts on: 03/25/2019 04:36 PM   Modules accepted: Level of Service

## 2019-09-15 ENCOUNTER — Ambulatory Visit (INDEPENDENT_AMBULATORY_CARE_PROVIDER_SITE_OTHER): Payer: Medicaid Other | Admitting: Family Medicine

## 2019-09-15 ENCOUNTER — Other Ambulatory Visit: Payer: Self-pay

## 2019-09-15 ENCOUNTER — Encounter: Payer: Self-pay | Admitting: Family Medicine

## 2019-09-15 ENCOUNTER — Other Ambulatory Visit (HOSPITAL_COMMUNITY)
Admission: RE | Admit: 2019-09-15 | Discharge: 2019-09-15 | Disposition: A | Discharge status: Home / Self Care | Payer: Medicaid Other | Source: Ambulatory Visit | Attending: Family Medicine | Admitting: Family Medicine

## 2019-09-15 VITALS — BP 100/62 | HR 85 | Ht 63.0 in | Wt 194.4 lb

## 2019-09-15 DIAGNOSIS — N898 Other specified noninflammatory disorders of vagina: Secondary | ICD-10-CM | POA: Diagnosis present

## 2019-09-15 DIAGNOSIS — L731 Pseudofolliculitis barbae: Secondary | ICD-10-CM

## 2019-09-15 DIAGNOSIS — Z113 Encounter for screening for infections with a predominantly sexual mode of transmission: Secondary | ICD-10-CM

## 2019-09-15 LAB — POCT WET PREP (WET MOUNT)
Clue Cells Wet Prep Whiff POC: NEGATIVE
Trichomonas Wet Prep HPF POC: ABSENT

## 2019-09-15 NOTE — Patient Instructions (Signed)
Regarding you vaginal discharge, we did not see anything in the clinic today that needs to be treated.  I will let you know if there is anything that needs to be followed up on with the remainder of your labs.

## 2019-09-15 NOTE — Assessment & Plan Note (Signed)
Feels safe in current relationship.  No significant findings on wet prep. -Follow-up HIV, RPR, GC/chlamydia

## 2019-09-15 NOTE — Assessment & Plan Note (Signed)
Physical exam shows no evidence of vesicles or concerning rash.  The location is not consistent with a Bartholin cyst.  Low suspicion for infection.  The small, mobile, nontender mass noted in her left labia majora is most consistent with an ingrown hair. -Reassurance provided -Advised that shaving may exacerbate ingrown hairs.  Electric trimmer may be more helpful than a straight razor.

## 2019-09-15 NOTE — Progress Notes (Signed)
    SUBJECTIVE:   CHIEF COMPLAINT / HPI:   Screening for STI Sabrina Buckley reports that she has had mild vaginal discharge recently but she is not sure if that is her normal physiologic discharge or something new.  She has not noted any significant malodor or itching.  She denies pelvic pain, nausea, vomiting, fever.  She is sexually active with 1 female partner.  She feels safe in her relationship.  She would like to be tested for STIs. Light vaginal discharge unsure if it is normal or not.  She has previously had her tubes tied.  vulvovaginal bumps She has noted small bumps around her vagina that seem to come and go.  These bumps are smaller than the finger and are not generally irritating.  She has not noted them to be itchy or painful.  She would like to know if she should be concerned about these bumps and if anything can be done about them.  She has not noted any obvious drainage.  They are not fluid-filled.  PERTINENT  PMH / PSH: none  OBJECTIVE:   BP 100/62   Pulse 85   Ht 5\' 3"  (1.6 m)   Wt 194 lb 6 oz (88.2 kg)   LMP 03/16/2019   SpO2 99%   BMI 34.43 kg/m    General: Well-appearing 34 year old woman.  No acute distress. Pelvic: Normal external female genitalia.  Small, 0.5 cm or smaller bumps noted under the skin of the left labia majora, no skin changes, discharge, rashes.  No palpable lymphadenopathy. Healthy vaginal mucosa.  No obvious vaginal discharge.  Os visualized.  No adnexal or fundal masses appreciated on bimanual exam.  ASSESSMENT/PLAN:   Routine screening for STI (sexually transmitted infection) Feels safe in current relationship.  No significant findings on wet prep. -Follow-up HIV, RPR, GC/chlamydia  Ingrown hair Physical exam shows no evidence of vesicles or concerning rash.  The location is not consistent with a Bartholin cyst.  Low suspicion for infection.  The small, mobile, nontender mass noted in her left labia majora is most consistent with an  ingrown hair. -Reassurance provided -Advised that shaving may exacerbate ingrown hairs.  Electric trimmer may be more helpful than a straight razor.   At the end of our encounter, she noted that she would like to discuss her amenorrhea.  She has previously had a work-up started for amenorrhea.  She has had no menstrual period in roughly 6 months.  She was encouraged to return to clinic for a longer discussion and further work-up regarding her amenorrhea.  20, MD [facility]

## 2019-09-16 LAB — CERVICOVAGINAL ANCILLARY ONLY
Chlamydia: NEGATIVE
Comment: NEGATIVE
Comment: NORMAL
Neisseria Gonorrhea: NEGATIVE

## 2019-09-16 LAB — HIV ANTIBODY (ROUTINE TESTING W REFLEX): HIV Screen 4th Generation wRfx: NONREACTIVE

## 2019-09-16 LAB — RPR: RPR Ser Ql: NONREACTIVE

## 2019-10-13 ENCOUNTER — Other Ambulatory Visit: Payer: Self-pay

## 2019-10-13 ENCOUNTER — Ambulatory Visit: Payer: Medicaid Other | Admitting: Family Medicine

## 2019-10-13 ENCOUNTER — Other Ambulatory Visit (HOSPITAL_COMMUNITY)
Admission: RE | Admit: 2019-10-13 | Discharge: 2019-10-13 | Disposition: A | Discharge status: Home / Self Care | Payer: Medicaid Other | Source: Ambulatory Visit | Attending: Family Medicine | Admitting: Family Medicine

## 2019-10-13 VITALS — BP 102/68 | HR 82 | Ht 63.0 in | Wt 197.2 lb

## 2019-10-13 DIAGNOSIS — B373 Candidiasis of vulva and vagina: Secondary | ICD-10-CM | POA: Diagnosis not present

## 2019-10-13 DIAGNOSIS — N898 Other specified noninflammatory disorders of vagina: Secondary | ICD-10-CM | POA: Insufficient documentation

## 2019-10-13 DIAGNOSIS — B3731 Acute candidiasis of vulva and vagina: Secondary | ICD-10-CM

## 2019-10-13 LAB — POCT WET PREP (WET MOUNT)
Clue Cells Wet Prep Whiff POC: NEGATIVE
Trichomonas Wet Prep HPF POC: ABSENT

## 2019-10-13 MED ORDER — FLUCONAZOLE 150 MG PO TABS: 150.0000 mg | ORAL_TABLET | Freq: Once | ORAL | 0 refills | Status: AC

## 2019-10-13 NOTE — Assessment & Plan Note (Signed)
Wet prep shows many yeast hyphae. -Fluconazole 150 mg x 1 -Follow-up HIV, RPR -Follow-up GC/committee

## 2019-10-13 NOTE — Patient Instructions (Signed)
The labs that we did in clinic today were notable for a yeast infection.  This is common and very easy to treat.  I have already ordered you the antifungal pill that you only need to take 1 time.  You should see significant improvement in the next day or 2.  I will let you know about the remainder of your tests from today.  If they are normal, I will simply send you a MyChart message.  If there are any abnormalities, I will give you a call.

## 2019-10-13 NOTE — Progress Notes (Signed)
    SUBJECTIVE:   CHIEF COMPLAINT / HPI:   Vaginal discharge [name] presents to clinic today for vaginal discharge for roughly 1 week.  She reports that she has had a whitish/yellowish vaginal discharge with some mild discomfort.  Not significantly itchy.  It is malodorous.  She is sexually active although she has not been active in the last month or so.  She was wondering if this discharge could be related to a new soap.  She does not douche.  PERTINENT  PMH / PSH: Previous tubal ligation  OBJECTIVE:   BP 102/68   Pulse 82   Ht 5\' 3"  (1.6 m)   Wt 197 lb 3.2 oz (89.4 kg)   SpO2 99%   BMI 34.93 kg/m    General: Well-appearing woman seated comfortably in the exam room.  No acute distress. Pelvic: Normal-appearing external genitalia.  Healthy vaginal mucosa.  White, chunky vaginal discharge.  Samples taken.  No CVA tenderness, no adnexal masses.  Normal uterine size.  ASSESSMENT/PLAN:   Vaginal candidiasis Wet prep shows many yeast hyphae. -Fluconazole 150 mg x 1 -Follow-up HIV, RPR -Follow-up GC/committee     , MD [facility]

## 2019-10-14 LAB — HIV ANTIBODY (ROUTINE TESTING W REFLEX): HIV Screen 4th Generation wRfx: NONREACTIVE

## 2019-10-14 LAB — RPR: RPR Ser Ql: NONREACTIVE

## 2019-10-15 LAB — CERVICOVAGINAL ANCILLARY ONLY
Chlamydia: NEGATIVE
Comment: NEGATIVE
Comment: NORMAL
Neisseria Gonorrhea: NEGATIVE

## 2019-11-17 ENCOUNTER — Ambulatory Visit: Payer: Medicaid Other | Admitting: Family Medicine

## 2019-11-18 ENCOUNTER — Other Ambulatory Visit: Payer: Self-pay

## 2019-11-18 ENCOUNTER — Ambulatory Visit (INDEPENDENT_AMBULATORY_CARE_PROVIDER_SITE_OTHER): Payer: Medicaid Other | Admitting: Family Medicine

## 2019-11-18 VITALS — BP 114/64 | HR 73 | Ht 63.0 in | Wt 197.5 lb

## 2019-11-18 DIAGNOSIS — N970 Female infertility associated with anovulation: Secondary | ICD-10-CM

## 2019-11-18 DIAGNOSIS — R1032 Left lower quadrant pain: Secondary | ICD-10-CM | POA: Insufficient documentation

## 2019-11-18 NOTE — Assessment & Plan Note (Signed)
Left lower quadrant pain.  Given the patient has virtually no symptoms, likely MSK?Marland Kitchen  Possibly anovulatory cyst?  Does not seem to be cystitis given lack of dysuria or other GU symptoms.  Unlikely ectopic pregnancy given her history of tubal ligation.  Will get UA and urine pregnancy to rule this out.  Given history of anovulation, recommend getting pelvic ultrasound to assess endometrium.

## 2019-11-18 NOTE — Patient Instructions (Signed)
You make ibuprofen for your abdominal pain.   We will call you with your lab results.   Please go get your pelvic ultrasound at the time listed below.

## 2019-11-18 NOTE — Progress Notes (Signed)
    SUBJECTIVE:   CHIEF COMPLAINT / HPI:   Patient presents with 4 days of left lower quadrant pain.  It is not associated with nausea, vomiting, dysuria, hematuria, urinary frequency, constipation, diarrhea, fever, chills.  It is getting better.  Patient is tolerating p.o.  Patient is taking ibuprofen as needed which does improve the pain.  The pain is crampy.  It is brought on it by walking or bending over.  Patient endorses that her last menstrual period was in September.  PERTINENT  PMH / PSH:   OBJECTIVE:   BP 114/64   Pulse 73   Ht 5\' 3"  (1.6 m)   Wt 197 lb 8 oz (89.6 kg)   LMP 03/10/2019   SpO2 99%   BMI 34.99 kg/m   Gen: NAD, resting comfortably CV: RRR with no murmurs appreciated Pulm: NWOB, CTAB with no crackles, wheezes, or rhonchi GI: Normal bowel sounds present. Soft, Nontender, Nondistended. MSK: no edema, cyanosis, or clubbing noted Skin: warm, dry Neuro: grossly normal, moves all extremities Psych: Normal affect and thought content Chaperoned by 05/10/2019  ASSESSMENT/PLAN:   Left lower quadrant pain Left lower quadrant pain.  Given the patient has virtually no symptoms, likely MSK?Doran Durand  Possibly anovulatory cyst?  Does not seem to be cystitis given lack of dysuria or other GU symptoms.  Unlikely ectopic pregnancy given her history of tubal ligation.  Will get UA and urine pregnancy to rule this out.  Given history of anovulation, recommend getting pelvic ultrasound to assess endometrium.     Marland Kitchen, MD [facility]

## 2019-11-30 ENCOUNTER — Ambulatory Visit
Admission: RE | Admit: 2019-11-30 | Discharge: 2019-11-30 | Disposition: A | Discharge status: Home / Self Care | Payer: Medicaid Other | Source: Ambulatory Visit | Attending: Family Medicine | Admitting: Family Medicine

## 2019-11-30 DIAGNOSIS — N970 Female infertility associated with anovulation: Secondary | ICD-10-CM

## 2019-12-01 ENCOUNTER — Telehealth: Payer: Self-pay | Admitting: Family Medicine

## 2019-12-01 NOTE — Telephone Encounter (Signed)
Called patient back to discuss pelvic US. No answer. LVM to call office back.

## 2019-12-10 ENCOUNTER — Ambulatory Visit (INDEPENDENT_AMBULATORY_CARE_PROVIDER_SITE_OTHER): Payer: Medicaid Other | Admitting: Family Medicine

## 2019-12-10 ENCOUNTER — Other Ambulatory Visit: Payer: Self-pay

## 2019-12-10 VITALS — BP 100/60 | HR 73 | Ht 63.0 in | Wt 196.5 lb

## 2019-12-10 DIAGNOSIS — N911 Secondary amenorrhea: Secondary | ICD-10-CM

## 2019-12-10 MED ORDER — MEDROXYPROGESTERONE ACETATE 10 MG PO TABS: 10.0000 mg | ORAL_TABLET | Freq: Every day | ORAL | 0 refills | Status: DC

## 2019-12-10 NOTE — Progress Notes (Signed)
    SUBJECTIVE:   CHIEF COMPLAINT / HPI:   Patient is following up for amenorrhea.  Pelvic ultrasound was negative for an ovulatory cyst.  In the past she has had normal hormonal work-up including TSH, LH, FSH, prolactin.  She reports that her abdominal pain is resolved.  She does report hot flashes at night sweats.   OBJECTIVE:   BP 100/60   Pulse 73   Ht 5\' 3"  (1.6 m)   Wt 196 lb 8 oz (89.1 kg)   LMP 03/10/2019   SpO2 96%   BMI 34.81 kg/m   Gen: NAD, resting comfortably Pulm: NWOB,  Skin: warm, dry Neuro: grossly normal, moves all extremities Psych: Normal affect and thought content   ASSESSMENT/PLAN:   Secondary amenorrhea We will get testosterone levels.  If elevated, patient likely has PCOS.  If normal, will try Provera taper.  Given her history of hot flashes, may be premature ovarian failure.  If Provera withdrawal does not work, will refer to OB/GYN.     05/10/2019, MD [facility]

## 2019-12-10 NOTE — Patient Instructions (Signed)
We are checking your testosterone level.  If it is normal, you may take the Provera as prescribed. This should cause bleeding. If not, call the office and we can refer you to [facility] gyn for further work up.

## 2019-12-10 NOTE — Assessment & Plan Note (Signed)
We will get testosterone levels.  If elevated, patient likely has PCOS.  If normal, will try Provera taper.  Given her history of hot flashes, may be premature ovarian failure.  If Provera withdrawal does not work, will refer to OB/GYN.

## 2019-12-11 LAB — TESTOSTERONE: Testosterone: 8 ng/dL (ref 8–60)

## 2019-12-12 ENCOUNTER — Encounter: Payer: Self-pay | Admitting: Family Medicine

## 2020-06-14 ENCOUNTER — Encounter: Payer: Self-pay | Admitting: Family Medicine

## 2020-06-14 ENCOUNTER — Other Ambulatory Visit (HOSPITAL_COMMUNITY)
Admission: RE | Admit: 2020-06-14 | Discharge: 2020-06-14 | Disposition: A | Discharge status: Home / Self Care | Payer: PRIVATE HEALTH INSURANCE | Source: Ambulatory Visit | Attending: Family Medicine | Admitting: Family Medicine

## 2020-06-14 ENCOUNTER — Ambulatory Visit (INDEPENDENT_AMBULATORY_CARE_PROVIDER_SITE_OTHER): Payer: Medicaid Other | Admitting: Family Medicine

## 2020-06-14 ENCOUNTER — Other Ambulatory Visit: Payer: Self-pay

## 2020-06-14 VITALS — BP 118/62 | HR 66 | Wt 198.0 lb

## 2020-06-14 DIAGNOSIS — N898 Other specified noninflammatory disorders of vagina: Secondary | ICD-10-CM | POA: Insufficient documentation

## 2020-06-14 DIAGNOSIS — Z113 Encounter for screening for infections with a predominantly sexual mode of transmission: Secondary | ICD-10-CM | POA: Diagnosis not present

## 2020-06-14 LAB — POCT WET PREP (WET MOUNT)
Clue Cells Wet Prep Whiff POC: NEGATIVE
Trichomonas Wet Prep HPF POC: ABSENT

## 2020-06-14 MED ORDER — FLUCONAZOLE 150 MG PO TABS: 150.0000 mg | ORAL_TABLET | Freq: Once | ORAL | 0 refills | Status: AC

## 2020-06-14 NOTE — Assessment & Plan Note (Signed)
Wet prep showed no evidence of BV, trichomonas or yeast infection today.  Based on history and clinical presentation, will treat her with Diflucan for empiric treatment for yeast to see if her symptoms improve.  She was informed that it is possible she might be experiencing symptoms of estrogen deficiency.  If she continues to experience spaced out periods and vaginal dryness, it may be helpful to use a low estrogen pill (Loestrin) or topical estrogen cream to help with her symptoms.  I discussed performing possible blood test to see if she is in the perimenopausal stage with Dr. Jacquelyne Balint who recommended that is not necessary because we can make the diagnosis based on her history.  Further visits for this issue can focus on symptomatic treatment as opposed to diagnostic investigation. -Follow-up GC/chlamydia -Follow-up HIV, RPR

## 2020-06-14 NOTE — Patient Instructions (Signed)
Vaginal itching/dryness: Think the most likely cause of your itching and dryness is a yeast infection.  For now we will treat your yeast infection with a pill called Diflucan.  I will let you know the results of the other tests that we will send out.  It looks like you are now having regular periods again and I have a lower suspicion that you are about to go through menopause.  If your periods are irregular, it is possible for wound to experience vaginal dryness as result of menopause and estrogen deficiency.  I do not think this is the most likely situation for you because you are young but I would keep it in the back of my mind.

## 2020-06-14 NOTE — Progress Notes (Signed)
    SUBJECTIVE:   CHIEF COMPLAINT / HPI:   Vaginal discharge Sabrina Buckley reports that she has been having vaginal symptoms for roughly the past week.  This started primarily with vaginal dryness/itching.  She did note some chunky vaginal discharge as well.  Several days ago, she noted that she had some lower abdominal/pelvic pain which is since resolved.  She is sexually active and would like to be tested for STIs.  She notes that she is currently on her menses.  PERTINENT  PMH / PSH: Tubal ligation  OBJECTIVE:   BP 118/62   Pulse 66   Wt 198 lb (89.8 kg)   LMP 06/12/2020 (Exact Date)   SpO2 98%   BMI 35.07 kg/m    General: Alert and cooperative and appears to be in no acute distress Respiratory: Breathing comfortably on room air.  No respiratory distress. Abdomen: Abdomen soft and non-tender. Pelvic: Normal-appearing external genitalia.  Mild bloody discharge.  Moist, pink vaginal mucosa.  Moderate menses.  Samples taken.  Bimanual exam showed no evidence of enlarged adnexa or cervical motion tenderness. Extremities: No peripheral edema. Warm/ well perfused.    ASSESSMENT/PLAN:   Vaginal dryness Wet prep showed no evidence of BV, trichomonas or yeast infection today.  Based on history and clinical presentation, will treat her with Diflucan for empiric treatment for yeast to see if her symptoms improve.  She was informed that it is possible she might be experiencing symptoms of estrogen deficiency.  If she continues to experience spaced out periods and vaginal dryness, it may be helpful to use a low estrogen pill (Loestrin) or topical estrogen cream to help with her symptoms.  I discussed performing possible blood test to see if she is in the perimenopausal stage with Dr. Jacquelyne Balint who recommended that is not necessary because we can make the diagnosis based on her history.  Further visits for this issue can focus on symptomatic treatment as opposed to diagnostic  investigation. -Follow-up GC/chlamydia -Follow-up HIV, RPR     Mirian Mo, MD [facility] Medicine [facility]

## 2020-06-15 LAB — HIV ANTIBODY (ROUTINE TESTING W REFLEX): HIV Screen 4th Generation wRfx: NONREACTIVE

## 2020-06-15 LAB — RPR: RPR Ser Ql: NONREACTIVE

## 2020-06-16 LAB — CERVICOVAGINAL ANCILLARY ONLY
Chlamydia: NEGATIVE
Comment: NEGATIVE
Comment: NORMAL
Neisseria Gonorrhea: NEGATIVE

## 2020-07-26 ENCOUNTER — Other Ambulatory Visit: Payer: Self-pay

## 2020-07-26 ENCOUNTER — Ambulatory Visit (INDEPENDENT_AMBULATORY_CARE_PROVIDER_SITE_OTHER): Payer: PRIVATE HEALTH INSURANCE | Admitting: Family Medicine

## 2020-07-26 DIAGNOSIS — L600 Ingrowing nail: Secondary | ICD-10-CM

## 2020-07-26 DIAGNOSIS — L731 Pseudofolliculitis barbae: Secondary | ICD-10-CM

## 2020-07-26 NOTE — Assessment & Plan Note (Signed)
The location and appearance of the bumps is consistent with ingrown hair.  I have a low suspicion for folliculitis, abscess formation, Bartholin duct cyst.  There was no concerning rash.  This is most consistent with ingrown hairs. -She was encouraged to avoid shaving her pelvis to avoid further ingrown hairs. -She was informed that soaks would not likely be helpful but she is welcome to try soaks -She was encouraged to use Hibiclens or topical antibiotics if they opened and began draining.

## 2020-07-26 NOTE — Patient Instructions (Signed)
It was great to see you today.  Here is a quick review of the things we talked about:  Ingrown toenail:  Lifestyle and home remedies You can treat most ingrown toenails at home. Here's how: Soak your feet in warm water. Do this for 15 to 20 minutes three to four times a day. Soaking reduces swelling and relieves tenderness. Place cotton or dental floss under your toenail. After each soaking, put fresh bits of cotton or waxed dental floss under the ingrown edge. This will help the nail grow above the skin edge. Apply antibiotic cream. Put antibiotic ointment on the tender area and bandage the toe. Choose sensible footwear. Consider wearing open-toed shoes or sandals until your toe feels better. Take pain relievers. Over-the-counter pain relievers such as acetaminophen (Tylenol, others), ibuprofen (Advil, Motrin IB, others) and naproxen sodium (Aleve) may help ease the toe pain.  Ingrown hair: The best remedy for this is to avoid shaving.  If you do notice any of these bumps to open up and drain, applying a topical antibiotic like Bactroban would be appropriate.  You can also try using an over-the-counter cleanser like Hibiclens although I do not think that going to be very helpful at this point because there is no evidence of drainage.  Again the best treatment is to avoid shaving.

## 2020-07-26 NOTE — Assessment & Plan Note (Signed)
We discussed that foot soaks can be helpful for now.  We will try that for the next several weeks.  If soaks are not helpful, we can removing part or all of the toenail. -She was provided information about bath soaks -Return to clinic if this is not helpful.

## 2020-07-26 NOTE — Progress Notes (Signed)
    SUBJECTIVE:   CHIEF COMPLAINT / HPI:   Ingrown toenail Ms. Kolasinski has noted some mild tenderness on the inside of her right and left big toe.  She notices this mostly when she presses down on her toenail.  When she is a pedicure, her nails become quite sensitive and she sometimes has trouble tolerating it.  Ingrown hair She is noted 5 or 6 small bumps on her vagina that have been bothersome for several months.  She suspects these may be ingrown hairs.  She does shave her pelvis.  She has not noted that these bumps have gotten worse or grown.  She denies any Raynaud's from the bumps.  She is not having any vaginal symptoms at this time.  PERTINENT  PMH / PSH: Noncontributory  OBJECTIVE:   BP 110/72   Pulse 73   Wt 199 lb 6.4 oz (90.4 kg)   SpO2 97%   BMI 35.32 kg/m   General: Alert and cooperative and appears to be in no acute distress Respiratory: Breathing comfortably on room air.  No respiratory distress. Pelvic: Normal-appearing external genitalia.  She was able to point out 5-6 small nodules on her labia majora.  Each nodule was less than 0.5 cm in diameter.  Mildly tender to palpation.  No evidence draining or erythema from the bumps.  ASSESSMENT/PLAN:   Ingrown toenail We discussed that foot soaks can be helpful for now.  We will try that for the next several weeks.  If soaks are not helpful, we can removing part or all of the toenail. -She was provided information about bath soaks -Return to clinic if this is not helpful.  Ingrown hair The location and appearance of the bumps is consistent with ingrown hair.  I have a low suspicion for folliculitis, abscess formation, Bartholin duct cyst.  There was no concerning rash.  This is most consistent with ingrown hairs. -She was encouraged to avoid shaving her pelvis to avoid further ingrown hairs. -She was informed that soaks would not likely be helpful but she is welcome to try soaks -She was encouraged to use Hibiclens  or topical antibiotics if they opened and began draining.     Mirian Mo, MD [facility]

## 2020-12-21 ENCOUNTER — Encounter: Payer: Self-pay | Admitting: Family Medicine

## 2020-12-21 ENCOUNTER — Ambulatory Visit (INDEPENDENT_AMBULATORY_CARE_PROVIDER_SITE_OTHER): Payer: PRIVATE HEALTH INSURANCE | Admitting: Family Medicine

## 2020-12-21 ENCOUNTER — Other Ambulatory Visit: Payer: Self-pay

## 2020-12-21 VITALS — BP 120/84 | HR 75 | Ht 63.0 in | Wt 195.4 lb

## 2020-12-21 DIAGNOSIS — Z131 Encounter for screening for diabetes mellitus: Secondary | ICD-10-CM | POA: Diagnosis not present

## 2020-12-21 DIAGNOSIS — L600 Ingrowing nail: Secondary | ICD-10-CM

## 2020-12-21 DIAGNOSIS — L03039 Cellulitis of unspecified toe: Secondary | ICD-10-CM

## 2020-12-21 DIAGNOSIS — B351 Tinea unguium: Secondary | ICD-10-CM | POA: Diagnosis not present

## 2020-12-21 DIAGNOSIS — Z1159 Encounter for screening for other viral diseases: Secondary | ICD-10-CM

## 2020-12-21 DIAGNOSIS — E669 Obesity, unspecified: Secondary | ICD-10-CM

## 2020-12-21 DIAGNOSIS — Z6834 Body mass index (BMI) 34.0-34.9, adult: Secondary | ICD-10-CM

## 2020-12-21 LAB — POCT GLYCOSYLATED HEMOGLOBIN (HGB A1C): Hemoglobin A1C: 5.5 % (ref 4.0–5.6)

## 2020-12-21 MED ORDER — TERBINAFINE HCL 250 MG PO TABS: 250.0000 mg | ORAL_TABLET | Freq: Every day | ORAL | 1 refills | Status: AC

## 2020-12-21 MED ORDER — NICOTINE 14 MG/24HR TD PT24: 14.0000 mg | MEDICATED_PATCH | Freq: Every day | TRANSDERMAL | 1 refills | Status: AC

## 2020-12-21 NOTE — Assessment & Plan Note (Signed)
A1c 5.5 today.  Low suspicion for diabetes.  We discussed appropriate nutrition and exercise for weight loss of 10% of her body weight which has significant health benefits.  It may be helpful to consider pharmacotherapy in the future if she does have a trend of weight gain.

## 2020-12-21 NOTE — Progress Notes (Signed)
    SUBJECTIVE:   CHIEF COMPLAINT / HPI:    Education/employment: She currently works as a Education administrator.  Activities: She is not currently engaged any regular activity.  She reports that she does walk quite a bit at work but does not engage in a regular, daily activity.  Drug use (smoking/alcohol/illicit): currently smokes about 1/2 ppd.  Has tried stopping in the past without success.  She is interested in refilling her nicotine patches.  Sexual activity (birth control): Currently sexually active.  No concerns at this time.  Has previously had a tubal ligation.  Mood: PHQ-9 filled out without evidence of depression.  Cancer screening: Pap smear up-to-date.  No evidence of early breast cancer in the family.  Toe concerns She currently has discomfort in both of her large toes.  She notices that the nail seem to be curling inward which causes some discomfort.  She also notices some thickening or raising of the nail and wants to know if anything else can be done for this.  Obesity Chart review shows stable weight with a several pound weight loss since her last visit.  She reports she is not generally conscious of her weight and does not pay a lot of attention to it.  She believes she is more or less maintained her weight throughout her adult life.   PERTINENT  PMH / PSH: Obesity  OBJECTIVE:   BP 120/84   Pulse 75   Ht 5\' 3"  (1.6 m)   Wt 195 lb 6.4 oz (88.6 kg)   SpO2 99%   BMI 34.61 kg/m    General: Alert and cooperative and appears to be in no acute distress Cardio: Normal S1 and S2, no S3 or S4. Rhythm is regular. No murmurs or rubs.   Pulm: Clear to auscultation bilaterally, no crackles, wheezing, or diminished breath sounds. Normal respiratory effort Abdomen: Bowel sounds normal. Abdomen soft and non-tender.  Extremities: No peripheral edema. Warm/ well perfused.  Strong radial pulses. Neuro: Cranial nerves grossly intact   Media  Information         Document Information  Photos    12/21/2020 16:15  Attached To:  Office Visit on 12/21/20 with 12/23/20, MD   Source Information  Mirian Mo, MD  [facility]     ASSESSMENT/PLAN:   Paronychia of great toe She is having a little bit of irritation of her toes.  Physical exam shows some curling and ingrown toenail.  I am suspicious of fungal infection as well.  I think would be very reasonable to see how she responds to a 6-week course of terbinafine.  We discussed that terbinafine does not have a perfect cure rate and recurrence is also very common.  She was interested in trying this medication today. -Terbinafine 250 mg daily for 6 weeks -Follow-up CMP today -Return in 3-4 weeks for repeat CMP to assess liver function -Warm bath soaks as needed to help with discomfort.  No need for topical antibiotics or systemic antibiotics at this time, low suspicion for bacterial infection.  OBESITY, UNSPECIFIED A1c 5.5 today.  Low suspicion for diabetes.  We discussed appropriate nutrition and exercise for weight loss of 10% of her body weight which has significant health benefits.  It may be helpful to consider pharmacotherapy in the future if she does have a trend of weight gain.     Mirian Mo, MD [facility]

## 2020-12-21 NOTE — Assessment & Plan Note (Signed)
She is having a little bit of irritation of her toes.  Physical exam shows some curling and ingrown toenail.  I am suspicious of fungal infection as well.  I think would be very reasonable to see how she responds to a 6-week course of terbinafine.  We discussed that terbinafine does not have a perfect cure rate and recurrence is also very common.  She was interested in trying this medication today. -Terbinafine 250 mg daily for 6 weeks -Follow-up CMP today -Return in 3-4 weeks for repeat CMP to assess liver function -Warm bath soaks as needed to help with discomfort.  No need for topical antibiotics or systemic antibiotics at this time, low suspicion for bacterial infection.

## 2020-12-21 NOTE — Patient Instructions (Addendum)
It was great to see you today.  Here is a quick review of the things we talked about:   Possible fungal infection of the nail: We are going to treat you with 6 weeks of an oral medicine to see if this helps your toenail.  We are going to check your liver today to make sure you do not have any liver trouble.  I would also like to check your liver function again in 3 weeks.  Weight: For now, I simply recommend appropriate nutrition and regular exercise.  If your weight does increase in the future, I think discussing weight loss medication could be helpful.  Pap smear: You do not need a Pap smear until next year  Smoking cessation: Smoking can be difficult to stop.  Keep up the good work.  I recommended finding other activities (like eating sunflower seeds or something) to help distract you from the habit of smoking.  I have refilled your nicotine patches

## 2020-12-22 LAB — COMPREHENSIVE METABOLIC PANEL
ALT: 13 IU/L (ref 0–32)
AST: 13 IU/L (ref 0–40)
Albumin/Globulin Ratio: 2.2 (ref 1.2–2.2)
Albumin: 4.8 g/dL (ref 3.8–4.8)
Alkaline Phosphatase: 76 IU/L (ref 44–121)
BUN/Creatinine Ratio: 12 (ref 9–23)
BUN: 9 mg/dL (ref 6–20)
Bilirubin Total: <0.2 mg/dL (ref 0.0–1.2)
CO2: 22 mmol/L (ref 20–29)
Calcium: 9.6 mg/dL (ref 8.7–10.2)
Chloride: 104 mmol/L (ref 96–106)
Creatinine, Ser: 0.75 mg/dL (ref 0.57–1.00)
Globulin, Total: 2.2 g/dL (ref 1.5–4.5)
Glucose: 87 mg/dL (ref 65–99)
Potassium: 4.2 mmol/L (ref 3.5–5.2)
Sodium: 140 mmol/L (ref 134–144)
Total Protein: 7 g/dL (ref 6.0–8.5)
eGFR: 106 mL/min/{1.73_m2} (ref >59)

## 2020-12-22 LAB — HEPATITIS C ANTIBODY: Hep C Virus Ab: 0.3 s/co ratio (ref 0.0–0.9)

## 2020-12-30 ENCOUNTER — Ambulatory Visit (INDEPENDENT_AMBULATORY_CARE_PROVIDER_SITE_OTHER): Payer: PRIVATE HEALTH INSURANCE | Admitting: Family Medicine

## 2020-12-30 ENCOUNTER — Other Ambulatory Visit: Payer: Self-pay

## 2020-12-30 VITALS — BP 110/76 | HR 76 | Ht 63.0 in | Wt 197.6 lb

## 2020-12-30 DIAGNOSIS — M545 Low back pain, unspecified: Secondary | ICD-10-CM | POA: Diagnosis not present

## 2020-12-30 DIAGNOSIS — M549 Dorsalgia, unspecified: Secondary | ICD-10-CM | POA: Insufficient documentation

## 2020-12-30 MED ORDER — DICLOFENAC SODIUM 1 % EX GEL: 2.0000 g | Freq: Four times a day (QID) | CUTANEOUS | 0 refills | Status: AC

## 2020-12-30 MED ORDER — MELOXICAM 15 MG PO TABS: 15.0000 mg | ORAL_TABLET | Freq: Every day | ORAL | 0 refills | Status: DC

## 2020-12-30 MED ORDER — KETOROLAC TROMETHAMINE 30 MG/ML IJ SOLN
30.0000 mg | Freq: Once | INTRAMUSCULAR | Status: AC
  Administered 2020-12-30: 30 mg via INTRAMUSCULAR

## 2020-12-30 NOTE — Progress Notes (Addendum)
     SUBJECTIVE:   CHIEF COMPLAINT / HPI:   Sabrina Buckley is a 35 y.o. female presents for back pain   Back Pain Onset: 5 days ago Location/Radiation: lumbar back Duration: constant   Character: sharp  Aggravating Factors: bending forward   Alleviating Factors: NSAID  Severity: 9/10  Denies history of trauma, history of prolonged steroid use, bowel or bladder incontinence, urinary retention, numbness or tingling in extremities or saddle anesthesia, weakness in extremities, fevers, chills, IV drug use, hemodialysis, hx cancer, changes in weight, night sweats. No history of osteoporosis.  Occupation: Works at [employer] as [employer].  Her job involves manual handling, bending and twisting motions.   Flowsheet Row Office Visit from 12/21/2020 in [facility]  PHQ-9 Total Score 0        PERTINENT  PMH / PSH: lateral epicondylitis, GERD  OBJECTIVE:   BP 110/76   Pulse 76   Ht 5\' 3"  (1.6 m)   Wt 197 lb 9.6 oz (89.6 kg)   SpO2 97%   BMI 35.00 kg/m    General: Alert, no acute distress Cardio: Well-perfused Pulm: normal work of breathing Extremities: No peripheral edema.  Neuro: Cranial nerves grossly intact    Back Normal skin, Spine with normal alignment and no deformity. Non tenderness to vertebral process palpation.  Lumbar paraspinous muscles are tender and with spasm.   Range of motion is full at neck and lumbar sacral regions. Negative straight leg test.   ASSESSMENT/PLAN:   Acute back pain Acute back pain over the last 4 days with minimal improvement with Advil.  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 She most likely has a musculoskeletal strain.  Back exam shows lumbar paraspinal muscle tenderness.  Normal flexion, extension of spine but with pain.  Patient received 30 mg injection of Toradol in clinic today. Recommend: -15mg  Mobic once a day for 7-10 days  -Tylenol 1000mg  4 times a day -Voltaren gel  to apply to the affected area -Gentle activity as tolerated -Heat/ice therapy -Follow-up if persistent symptoms within the next 1 to 2 weeks, can consider lumbar x-rays and referral to PT if no improvement     Herbalist, MD PGY-2 [facility] Medicine [facility]

## 2020-12-30 NOTE — Assessment & Plan Note (Signed)
Acute back pain over the last 4 days with minimal improvement with Advil.  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 She most likely has a musculoskeletal strain.  Back exam shows lumbar paraspinal muscle tenderness.  Normal flexion, extension of spine but with pain.  Patient received 30 mg injection of Toradol in clinic today. Recommend: -15mg  Mobic once a day for 7-10 days  -Tylenol 1000mg  4 times a day -Voltaren gel to apply to the affected area -Gentle activity as tolerated -Heat/ice therapy -Follow-up if persistent symptoms within the next 1 to 2 weeks, can consider lumbar x-rays and referral to PT if no improvement

## 2020-12-30 NOTE — Patient Instructions (Signed)
Thank you for coming to see me today. It was a pleasure. Today we discussed your back pain. I think it is related to a muscle spasm. Your received an injection in clinic today which hopefully helps settle the pain. I recommend: -Mobic once a day for 7-10 days  -Tylenol 1000mg  4 times a day -Voltaren gel to apply to the affected area -Stay as active as possible -Heat or ice therapy   Please follow-up with me in the next 2 weeks.   If you have any questions or concerns, please do not hesitate to call the office at [phone].  Best wishes,   Dr [phone]

## 2021-01-27 ENCOUNTER — Other Ambulatory Visit: Payer: Self-pay | Admitting: Family Medicine

## 2021-03-08 NOTE — Progress Notes (Deleted)
    SUBJECTIVE:   CHIEF COMPLAINT / HPI: concern for lost tampon   Concern for Lost Tampon  Patient reports ***   PERTINENT  PMH / PSH:  Vaginal Dryness  Obesity   OBJECTIVE:   There were no vitals taken for this visit.  Physical Exam    ASSESSMENT/PLAN:   No problem-specific Assessment & Plan notes found for this encounter.     Ronnald Ramp, MD [facility]

## 2021-03-09 ENCOUNTER — Ambulatory Visit: Payer: PRIVATE HEALTH INSURANCE | Admitting: Family Medicine

## 2021-03-23 ENCOUNTER — Telehealth: Payer: Self-pay

## 2021-03-23 NOTE — Telephone Encounter (Signed)
Patient calls nurse line reporting testing positive for COVID yesterday. Symptom onset on Tuesday. Reports throbbing headache, runny nose, chills, cough and loss of taste.   Patient is requesting oral antivirals.   Provided with ED precautions.   Please advise.    Veronda Prude, RN

## 2021-03-24 NOTE — Telephone Encounter (Signed)
Called patient to discuss recent COVID diagnosis and symptoms with request for antiviral medication. Patient is doing better today and just wanted to check if she should be treated with the medication. Patient is not on any contraindicated medications. Discussed risks and benefits of the medication and with shared decision making, decided to not fill Paxlovid at this time. Patient was advised that if she changes her mind, she has 5 days from symptom onset in which she will be eligible for the medication. Patient expressed understanding.   Morgin Halls, DO

## 2021-05-07 ENCOUNTER — Telehealth: Payer: Self-pay | Admitting: Family Medicine

## 2021-05-07 NOTE — Telephone Encounter (Signed)
error 

## 2021-12-12 ENCOUNTER — Encounter: Payer: Self-pay | Admitting: *Deleted

## 2022-02-19 ENCOUNTER — Encounter: Payer: Self-pay | Admitting: Family Medicine

## 2022-02-19 ENCOUNTER — Ambulatory Visit (INDEPENDENT_AMBULATORY_CARE_PROVIDER_SITE_OTHER): Payer: No Typology Code available for payment source | Admitting: Family Medicine

## 2022-02-19 ENCOUNTER — Other Ambulatory Visit (HOSPITAL_COMMUNITY)
Admission: RE | Admit: 2022-02-19 | Discharge: 2022-02-19 | Disposition: A | Discharge status: Home / Self Care | Payer: Medicaid Other | Source: Ambulatory Visit | Attending: Family Medicine | Admitting: Family Medicine

## 2022-02-19 VITALS — BP 106/80 | HR 78 | Ht 63.0 in | Wt 200.1 lb

## 2022-02-19 DIAGNOSIS — B3731 Acute candidiasis of vulva and vagina: Secondary | ICD-10-CM

## 2022-02-19 DIAGNOSIS — Z202 Contact with and (suspected) exposure to infections with a predominantly sexual mode of transmission: Secondary | ICD-10-CM | POA: Insufficient documentation

## 2022-02-19 DIAGNOSIS — Z124 Encounter for screening for malignant neoplasm of cervix: Secondary | ICD-10-CM | POA: Insufficient documentation

## 2022-02-19 MED ORDER — FLUCONAZOLE 150 MG PO TABS: 150.0000 mg | ORAL_TABLET | Freq: Once | ORAL | 0 refills | Status: AC

## 2022-02-19 NOTE — Patient Instructions (Signed)
Everything looks good today, we have sent your Pap smear for testing.  I have sent in a prescription for Diflucan, it is a one-time pill that you can take with yeast infection.

## 2022-02-19 NOTE — Progress Notes (Signed)
    SUBJECTIVE:   Chief compliant/HPI: annual examination  Sabrina Buckley is a 36 y.o. who presents today for an annual exam.   Review of systems form notable for folliculitis of pelvic area. Had intercourse with odd position and her leg was over-extended, has had pain for over a year with the right hip now upon palpation and sometimes has twinges when moving positions or getting up.  Updated history tabs and problem list .   OBJECTIVE:   BP 106/80   Pulse 78   Ht $R'5\' 3"'ev$  (1.6 m)   Wt 200 lb 2 oz (90.8 kg)   SpO2 97%   BMI 35.45 kg/m   Gen: well-appearing, NAD CV: RRR, no m/r/g appreciated, no peripheral edema Pulm: CTAB, no wheezes/crackles GI: soft, non-tender, non-distended MSK: right hip with full ROM, some slight pain at the extreme of FABER of right hip. Right hip abductors 4/5 strength (easily broken) on examination. Pelvic exam: VULVA: normal appearing vulva with no masses, tenderness or lesions, VAGINA: vaginal discharge - white and curd-like, CERVIX: normal appearing cervix without discharge or lesions, PAP: Pap smear done today, exam chaperoned by Salvatore Marvel, CMA.  ASSESSMENT/PLAN:   Weak hip abductors  Found on physical exam, patient with some pain and weakness of the area. No red flag signs.  - Encouraged exercise - Gave patient information for exercises specific to hip abductors  Yeast infection Vaginal yeast infection on examination during pap  - Diflucan $RemoveBe'150mg'LxrEGWXlC$  x1 dose   Annual Examination  See AVS for age appropriate recommendations.   PHQ score 0, reviewed and discussed. Blood pressure reviewed and at goal.  Asked about intimate partner violence and patient reports none.  The patient has BTL for contraception   Considered the following items based upon USPSTF recommendations: HIV testing: discussed Hepatitis C: discussed Hepatitis B: discussed Syphilis if at high risk: discussed GC/CT at high risk, ordered.  Lipid panel (nonfasting or  fasting) discussed based upon AHA recommendations and not ordered.  Consider repeat every 4-6 years.  Reviewed risk factors for latent tuberculosis and not indicated  Discussed family history, BRCA testing not indicated. Tool used to risk stratify was Pedigree Assessment tool   Cervical cancer screening: due for Pap today, cytology + HPV ordered Immunizations due for flu, currently not in stock in the office   Follow up in 1 year or sooner if indicated.    Rise Patience, [place]

## 2022-02-20 LAB — CERVICOVAGINAL ANCILLARY ONLY
Bacterial Vaginitis (gardnerella): NEGATIVE
Candida Glabrata: NEGATIVE
Candida Vaginitis: NEGATIVE
Comment: NEGATIVE
Comment: NEGATIVE
Comment: NEGATIVE

## 2022-02-20 LAB — HIV ANTIBODY (ROUTINE TESTING W REFLEX): HIV Screen 4th Generation wRfx: NONREACTIVE

## 2022-02-20 LAB — RPR: RPR Ser Ql: NONREACTIVE

## 2022-02-21 LAB — CYTOLOGY - PAP
Adequacy: ABSENT
Chlamydia: NEGATIVE
Comment: NEGATIVE
Comment: NEGATIVE
Comment: NEGATIVE
Comment: NORMAL
High risk HPV: POSITIVE — AB
Neisseria Gonorrhea: NEGATIVE
Trichomonas: NEGATIVE

## 2022-02-28 IMAGING — US US PELVIS COMPLETE WITH TRANSVAGINAL
1 series · 14 of 25 positions shown · non-contrast
Comparison: None

CLINICAL DATA: Anovulation; LMP March 2019



[Series 1: us pelvis complete with transvaginal · 0.21mm/px · 14 of 77 slices shown]
[im 1/77]
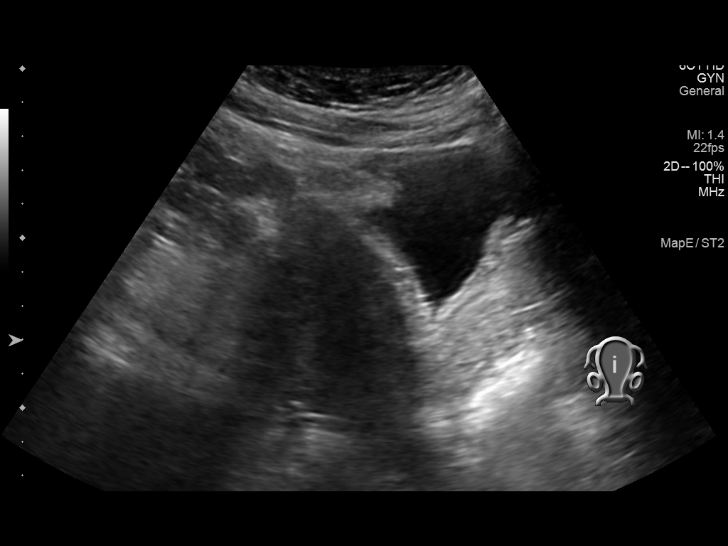
[im 7/77]
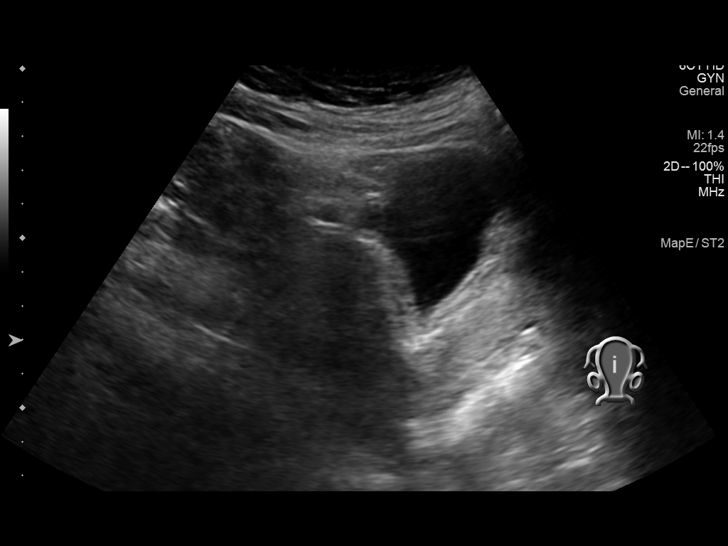
[im 13/77]
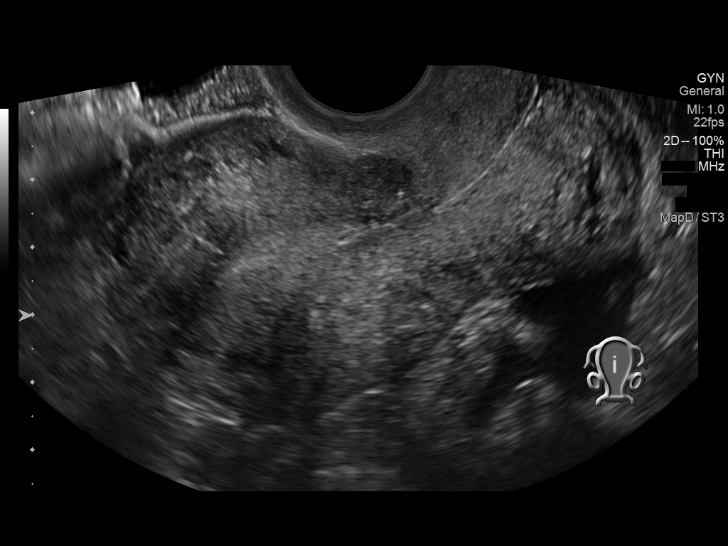
[im 20/77]
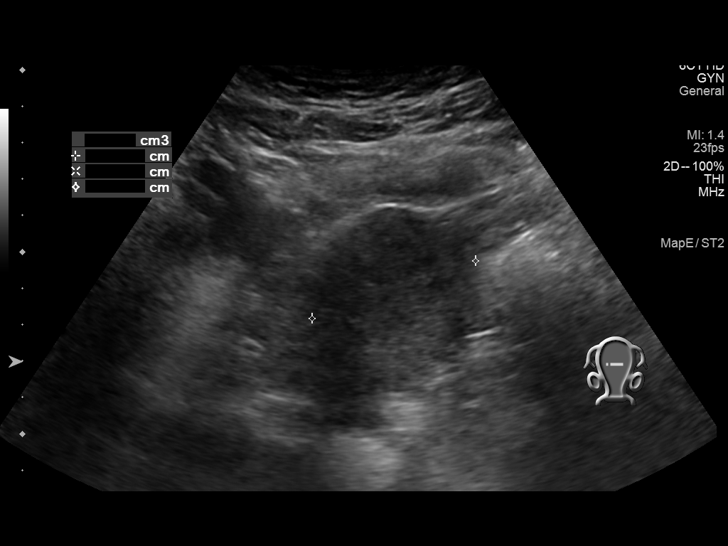
[im 26/77]
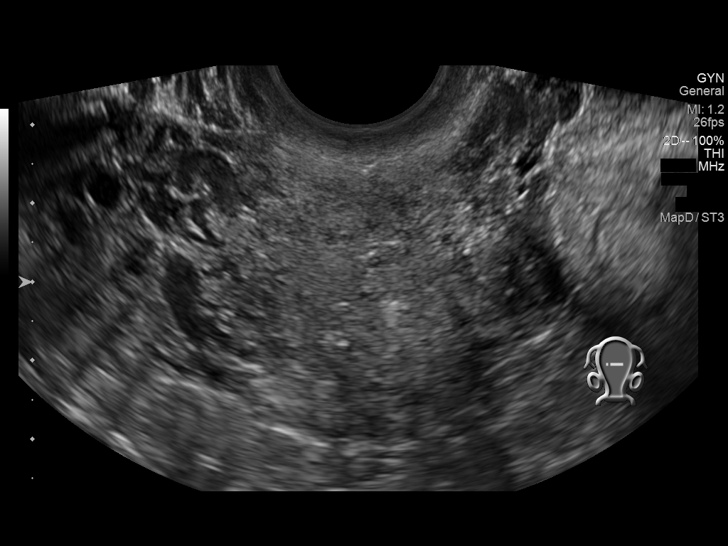
[im 29/77]
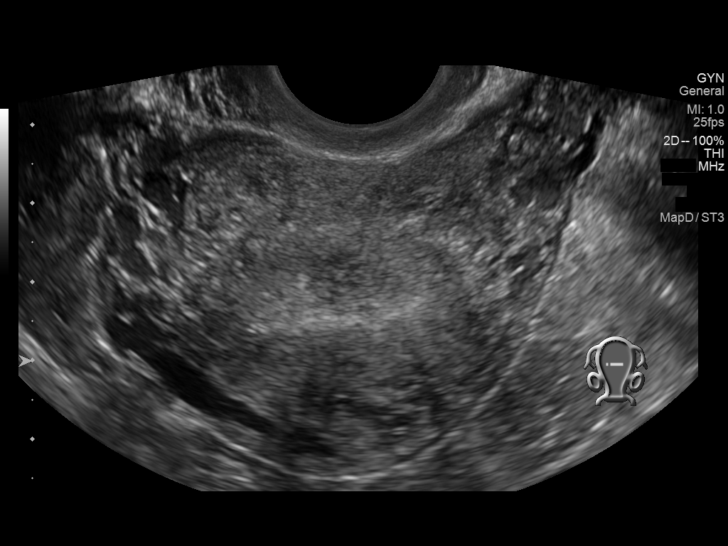
[im 35/77]
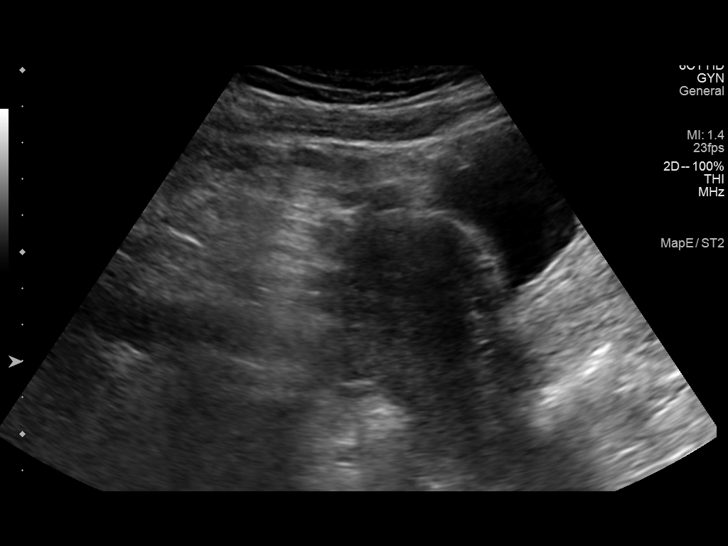
[im 42/77]
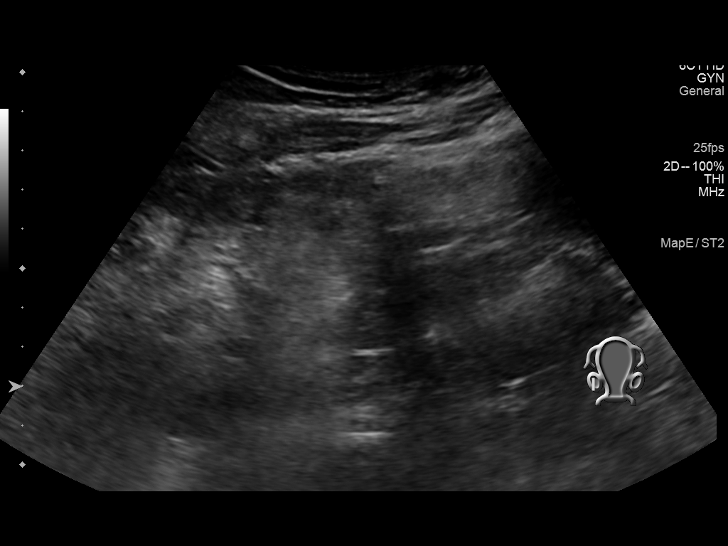
[im 48/77]
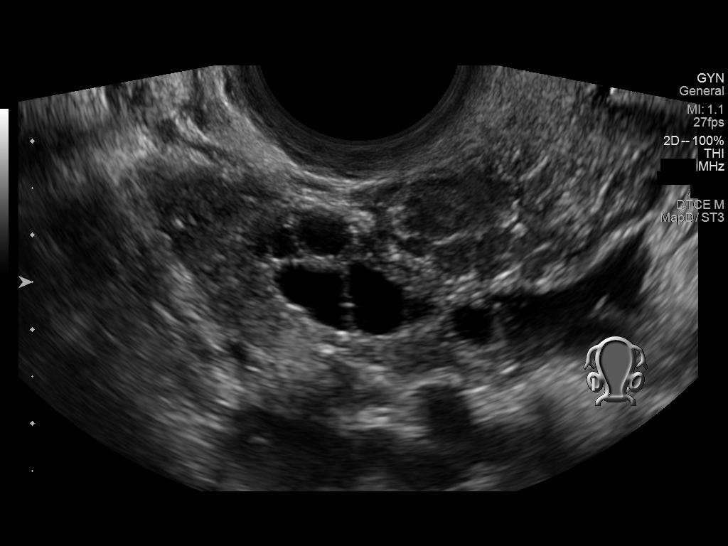
[im 51/77]
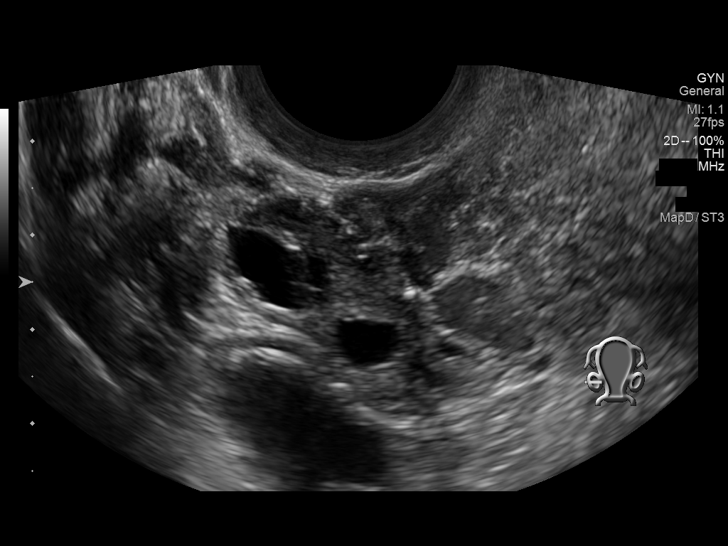
[im 58/77]
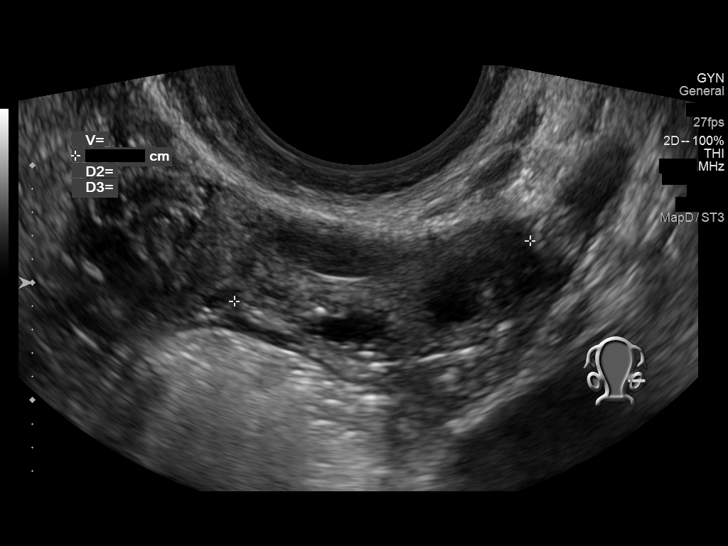
[im 64/77]
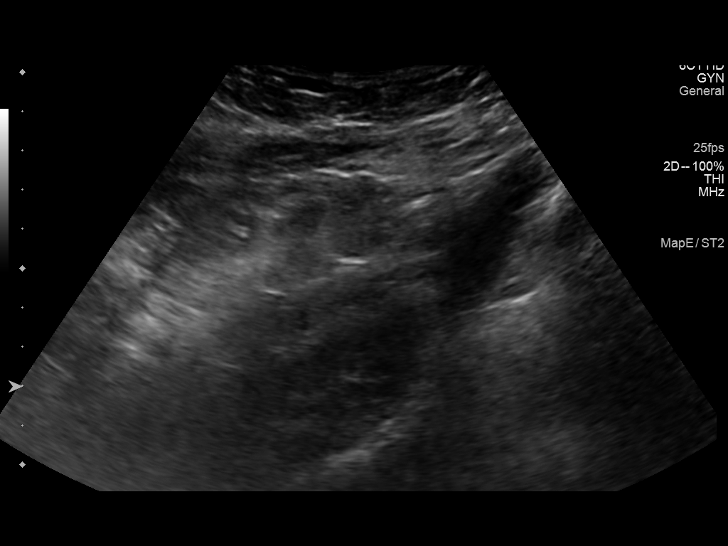
[im 70/77]
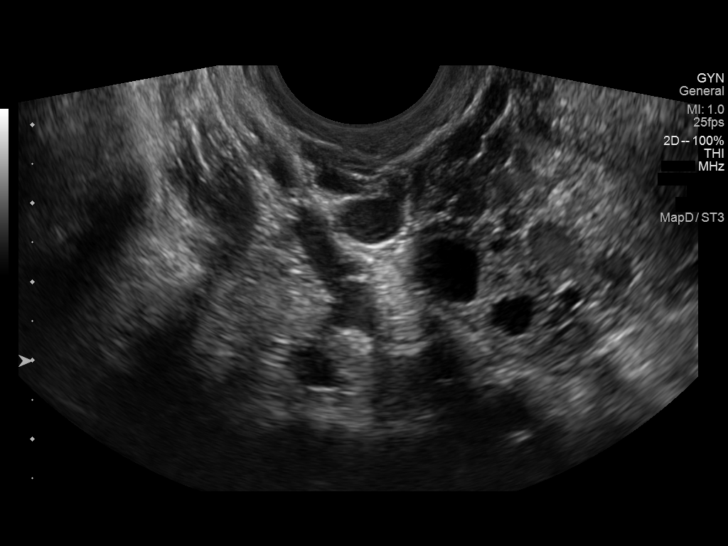
[im 77/77]
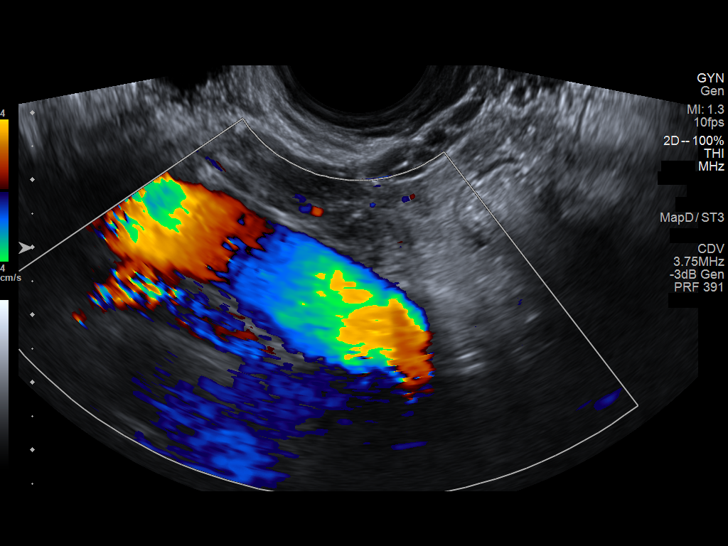

[14 of 25 positions shown; findings below may reference images not displayed]

FINDINGS: Uterus

Measurements: 7.1 x 5.3 x 4.8 cm = volume: 94 mL. Anteverted. Mildly
heterogeneous myometrium. Small focal intramural leiomyoma at
posterior upper uterus 18 x 16 x 15 mm. No additional masses.

Endometrium

Thickness: 4 mm.  No endometrial fluid or focal abnormality

Right ovary

Measurements: 3.1 x 1.6 x 1.9 cm = volume: 5.0 mL. Normal morphology
without mass

Left ovary

Measurements: 2.4 x 1.1 x 2.6 cm = volume: 3.5 mL. Normal morphology
without mass

Other findings

Trace free pelvic fluid.  No adnexal masses.
IMPRESSION: Small intramural leiomyoma at posterior upper uterus 18 mm greatest
size.

Remainder of exam unremarkable.

## 2022-03-06 ENCOUNTER — Ambulatory Visit: Payer: No Typology Code available for payment source | Admitting: Pharmacist

## 2022-11-05 ENCOUNTER — Ambulatory Visit (INDEPENDENT_AMBULATORY_CARE_PROVIDER_SITE_OTHER): Payer: No Typology Code available for payment source | Admitting: Family Medicine

## 2022-11-05 VITALS — BP 126/90 | HR 72 | Ht 63.0 in | Wt 202.4 lb

## 2022-11-05 DIAGNOSIS — J441 Chronic obstructive pulmonary disease with (acute) exacerbation: Secondary | ICD-10-CM

## 2022-11-05 DIAGNOSIS — I824Y2 Acute embolism and thrombosis of unspecified deep veins of left proximal lower extremity: Secondary | ICD-10-CM | POA: Diagnosis not present

## 2022-11-05 MED ORDER — PREDNISONE 20 MG PO TABS: 40.0000 mg | ORAL_TABLET | Freq: Every day | ORAL | 0 refills | Status: AC

## 2022-11-05 MED ORDER — RIVAROXABAN (XARELTO) VTE STARTER PACK (15 & 20 MG): ORAL_TABLET | ORAL | 0 refills | Status: DC

## 2022-11-05 MED ORDER — AZITHROMYCIN 250 MG PO TABS: ORAL_TABLET | ORAL | 0 refills | Status: AC

## 2022-11-05 NOTE — Patient Instructions (Signed)
It was wonderful to see you today. Thank you for allowing me to be a part of your care. Below is a short summary of what we discussed at your visit today:  Cough with sputum I am treating you as though you have a COPD exacerbation. START prednisone 40 mg daily with breakfast x 5 days START azithromycin: 500 mg the first day followed by 250 mg for the next 4 days Return for lung function testing with our clinical pharmacist Dr. Raymondo Band after you are better.  Possible blood clot in the leg I have ordered a ultrasound for DVT of your left leg.  The nurse will call the radiology department first thing in the morning to get this scheduled.  She will then call you with the date and time.  Because we are so concern for a blood clot, I am already going to start you on the blood thinner.   I have prescribed the Xarelto starter pack.  Follow the instructions on the prescription. If the DVT ultrasound is negative for a blood clot, you can stop the Xarelto.  DO NOT TAKE MELOXICAM (MOBIC) while you are taking Xarelto.  Reasons to go to the hospital: Sudden shortness of breath Sudden chest pain Sudden fast heart rate Difficulty breathing  Please bring all of your medications to every appointment!  If you have any questions or concerns, please do not hesitate to contact us via phone or MyChart message.   Fayette Pho, MD

## 2022-11-05 NOTE — Progress Notes (Signed)
    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  38?)  Left leg pain, concerned for DVT Left lower leg pain since last week Feels knot in medial eft thigh  Not currently taking any estrogen products including OCPs Current smoker with 15 to 38 pack years Denies chest pain, SOB, tachycardia  PERTINENT  PMH / PSH:  Patient Active Problem List   Diagnosis Date Noted   COPD exacerbation (HCC) 11/06/2022   Acute deep vein thrombosis (DVT) of proximal vein of left lower extremity (HCC) 11/06/2022   Acute back pain 12/30/2020   Ingrown hair 09/15/2019   Carbuncle 04/27/2018   Right foot pain 01/01/2018   Secondary amenorrhea 11/01/2016   Anemia 10/16/2016   OBESITY, UNSPECIFIED 12/14/2008   GERD 08/04/2008   TOBACCO DEPENDENCE 09/05/2006    OBJECTIVE:   BP (!) 126/90   Pulse 72   Ht 5\' 3"  (1.6 m)   Wt 202 lb 6.4 oz (91.8 kg)   SpO2 100%   BMI 35.85 kg/m    General: Awake, alert, NAD Neck: No palpable lymphadenopathy Cardiac: Regular rate and rhythm, no murmurs, brisk cap refill, normal skin turgor Respiratory: Reduced lung sounds in all fields, however no wheezing or rhonchi heard Extremities: Left calf measures 42 cm at widest point, right calf measures 40 cm at widest point, left lower leg without obvious redness or swelling, painful calf squeeze on left, palpable firm nodule of medial left thigh with overlying patch of erythema approximately 4 inches diameter  ASSESSMENT/PLAN:   COPD exacerbation  (HCC) Given new onset cough with sputum production, no other URI symptoms, and 15-18-pack-year history tobacco use I am diagnosing this as a COPD exacerbation.  Rx prednisone and azithromycin.  Patient will return for PFTs with Dr. Raymondo Band once her respiratory function has recovered.  Acute deep vein thrombosis (DVT) of proximal vein of left lower extremity (HCC) Highly concern for DVT of left leg given painful calf squeeze and palpable firm nodule of medial left thigh with overlying erythema.  Patient with 15-18-pack-year history of tobacco use.  No current estrogen containing products.  No signs or symptoms of PE (normal vitals, no respiratory distress or chest pain, Wells score for PE = 3 moderate risk).  Wells criteria for DVT score of 1, moderate risk.  Given that patient is clinically stable without signs of PE, will order outpatient DVT ultrasound.  However, given high clinical suspicion we will go ahead and start on Xarelto starter pack.  Discussed plan with patient, including ED precautions for PE symptoms.  Patient amenable to this plan.     Fayette Pho, MD [facility]

## 2022-11-06 DIAGNOSIS — I824Y2 Acute embolism and thrombosis of unspecified deep veins of left proximal lower extremity: Secondary | ICD-10-CM | POA: Insufficient documentation

## 2022-11-06 DIAGNOSIS — J441 Chronic obstructive pulmonary disease with (acute) exacerbation: Secondary | ICD-10-CM | POA: Insufficient documentation

## 2022-11-06 NOTE — Assessment & Plan Note (Signed)
Highly concern for DVT of left leg given painful calf squeeze and palpable firm nodule of medial left thigh with overlying erythema.  Patient with 15-18-pack-year history of tobacco use.  No current estrogen containing products.  No signs or symptoms of PE (normal vitals, no respiratory distress or chest pain, Wells score for PE = 3 moderate risk).  Wells criteria for DVT score of 1, moderate risk.  Given that patient is clinically stable without signs of PE, will order outpatient DVT ultrasound.  However, given high clinical suspicion we will go ahead and start on Xarelto starter pack.  Discussed plan with patient, including ED precautions for PE symptoms.  Patient amenable to this plan.

## 2022-11-06 NOTE — Assessment & Plan Note (Signed)
Given new onset cough with sputum production, no other URI symptoms, and 15-18-pack-year history tobacco use I am diagnosing this as a COPD exacerbation.  Rx prednisone and azithromycin.  Patient will return for PFTs with Dr. Raymondo Band once her respiratory function has recovered.

## 2022-11-08 ENCOUNTER — Ambulatory Visit (HOSPITAL_COMMUNITY)
Admission: RE | Admit: 2022-11-08 | Discharge: 2022-11-08 | Disposition: A | Discharge status: Home / Self Care | Payer: PRIVATE HEALTH INSURANCE | Source: Ambulatory Visit | Attending: Family Medicine | Admitting: Family Medicine

## 2022-11-08 ENCOUNTER — Telehealth: Payer: Self-pay

## 2022-11-08 DIAGNOSIS — I824Y2 Acute embolism and thrombosis of unspecified deep veins of left proximal lower extremity: Secondary | ICD-10-CM | POA: Diagnosis not present

## 2022-11-08 NOTE — Telephone Encounter (Signed)
DVT US final report back. It is negative. Called patient. She is happy to hear there is no DVT or superficial clot. Told her to stop Xarelto. Return precautions given.   Fayette Pho, MD

## 2022-11-08 NOTE — Telephone Encounter (Signed)
Vascular Lab calls nurse line to give call report.   Negative DVT.   Will forward to provider who saw patient.

## 2022-11-08 NOTE — Progress Notes (Signed)
VASCULAR LAB    Left lower extremity venous duplex has been performed.  See CV proc for preliminary results.  Called report to triage nurse  Sherren Kerns, RVT 11/08/2022, 10:40 AM

## 2022-11-08 NOTE — Telephone Encounter (Signed)
Called patient to discuss results. Preliminary DVT US negative for clot.  No answer, left VM.   Given these are preliminary results, will not officially have patient stop Xarelto until we have a final report in.   Fayette Pho, MD

## 2023-02-07 ENCOUNTER — Ambulatory Visit: Payer: No Typology Code available for payment source

## 2023-02-07 ENCOUNTER — Other Ambulatory Visit: Payer: Self-pay

## 2023-02-07 ENCOUNTER — Other Ambulatory Visit (HOSPITAL_COMMUNITY)
Admission: RE | Admit: 2023-02-07 | Discharge: 2023-02-07 | Disposition: A | Discharge status: Home / Self Care | Payer: No Typology Code available for payment source | Source: Ambulatory Visit | Attending: Family Medicine | Admitting: Family Medicine

## 2023-02-07 ENCOUNTER — Ambulatory Visit: Payer: No Typology Code available for payment source | Admitting: Student

## 2023-02-07 VITALS — BP 125/87 | HR 77 | Ht 63.0 in | Wt 200.2 lb

## 2023-02-07 DIAGNOSIS — N898 Other specified noninflammatory disorders of vagina: Secondary | ICD-10-CM | POA: Insufficient documentation

## 2023-02-07 DIAGNOSIS — Z113 Encounter for screening for infections with a predominantly sexual mode of transmission: Secondary | ICD-10-CM | POA: Diagnosis not present

## 2023-02-07 LAB — POCT WET PREP (WET MOUNT)
Clue Cells Wet Prep Whiff POC: NEGATIVE
Trichomonas Wet Prep HPF POC: ABSENT

## 2023-02-07 NOTE — Patient Instructions (Signed)
I will give you a call if any results from today are abnormal and require treatment. Otherwise, I will send you a message on mychart.

## 2023-02-07 NOTE — Progress Notes (Signed)
  SUBJECTIVE:   CHIEF COMPLAINT / HPI:   Vaginal discharge x2wk Yellowish discharge, fishy odor No new partners Using condoms LMP about 2.5 weeks ago No fevers, bleeding, abdominal pain Reports distant hx of BV. Hx chlamydia. Hx HPV, LSIL on Pap in 2023 Wants STD testing but declines blood test for HIV/RPR  PERTINENT  PMH / PSH:   Past Medical History:  Diagnosis Date   Abdominal adhesions    see 10/16/2016 op note   Anemia    Chlamydia    H/O Fitz-Hugh-Curtis syndrome    SVD (spontaneous vaginal delivery) x 2   Vaginal Pap smear, abnormal    Ventral hernia     OBJECTIVE:  BP 125/87   Pulse 77   Ht 5\' 3"  (1.6 m)   Wt 200 lb 3.2 oz (90.8 kg)   LMP 01/20/2023   SpO2 98%   BMI 35.46 kg/m   General: NAD, pleasant, able to participate in exam Respiratory: No respiratory distress Skin: warm and dry, no rashes noted Psych: Normal affect and mood  Pelvic: No significant irritation or erythema noted in vaginal canal.  Cervix visualized.  Small amount of discharge noted throughout vaginal canal and around the cervix.  Yellow/whitish in color.  There was a significant odor.  ASSESSMENT/PLAN:   1. Vaginal discharge 2. Screen for STD (sexually transmitted disease) Suspect BV vs trich given yellowish discharge with fishy odor. No systemic symptoms. Testing as below, will discuss w/ patient and start treatment as appropriate. - POCT Wet Prep [facility]) - Cervicovaginal ancillary only  Pt due for repeat pap as well which was not performed today - will inform patient to schedule appt for this when her results return   No orders of the defined types were placed in this encounter.  Return if symptoms worsen or fail to improve.  Vonna Drafts, MD [facility]

## 2023-06-24 ENCOUNTER — Other Ambulatory Visit (HOSPITAL_COMMUNITY)
Admission: RE | Admit: 2023-06-24 | Discharge: 2023-06-24 | Disposition: A | Discharge status: Home / Self Care | Payer: PRIVATE HEALTH INSURANCE | Source: Ambulatory Visit | Attending: Family Medicine | Admitting: Family Medicine

## 2023-06-24 ENCOUNTER — Ambulatory Visit: Payer: PRIVATE HEALTH INSURANCE

## 2023-06-24 VITALS — BP 120/80 | HR 74 | Ht 63.0 in | Wt 198.8 lb

## 2023-06-24 DIAGNOSIS — N898 Other specified noninflammatory disorders of vagina: Secondary | ICD-10-CM

## 2023-06-24 DIAGNOSIS — Z202 Contact with and (suspected) exposure to infections with a predominantly sexual mode of transmission: Secondary | ICD-10-CM | POA: Diagnosis present

## 2023-06-24 DIAGNOSIS — R87612 Low grade squamous intraepithelial lesion on cytologic smear of cervix (LGSIL): Secondary | ICD-10-CM | POA: Diagnosis not present

## 2023-06-24 LAB — POCT WET PREP (WET MOUNT)
Clue Cells Wet Prep Whiff POC: NEGATIVE
Trichomonas Wet Prep HPF POC: ABSENT

## 2023-06-24 NOTE — Patient Instructions (Signed)
It was great to see you! Thank you for allowing me to participate in your care!  Our plans for today:  -You are scheduled for Gyn clinic on 12/19 at 9:30 AM, this is important to make sure you do not have cervical cancer. -I will let you know the results of your testing and if we need to treat you with medications.   Please arrive 15 minutes PRIOR to your next scheduled appointment time! If you do not, this affects OTHER patients' care.  Take care and seek immediate care sooner if you develop any concerns.   Celine Mans, MD, PGY-2 [facility] 4:32 PM 06/24/2023  [facility]

## 2023-06-24 NOTE — Progress Notes (Signed)
    SUBJECTIVE:   CHIEF COMPLAINT / HPI: vaginal discharge  LSIL - LSIL seen on Pap smear last August, also positive for high risk HPV. ASCCP guidelines patient requires colposcopy.  Reports vaginal discharge since June.  Has noticed worsening pain and foul fishy odor.  Is sexually active with 1 partner.  Is not using protection.  Nuys itching or pain with urination.  PERTINENT  PMH / PSH: Hx of DVT   OBJECTIVE:   BP 120/80   Pulse 74   Ht 5\' 3"  (1.6 m)   Wt 198 lb 12.8 oz (90.2 kg)   SpO2 99%   BMI 35.22 kg/m   General: NAD  Neuro: A&O Respiratory: normal WOB on RA GU: Normal external genitalia, no obvious erythema, swelling, or lesions. No\ obvious vaginal discharge, vaginal walls without erythema or lesions. Cervix round, red, no lesions. Extremities: Moving all 4 extremities equally   ASSESSMENT/PLAN:   Assessment & Plan Vaginal discharge Concern for BV versus candidiasis versus secretion infection.  Will treat as indicated based on results of wet prep and cervicovaginal ancillary test.  Will additionally test for syphilis and HPV.  Patient is agreeable to plan. -Wet prep negative. Gonorrhea and chlamydia testing pending LGSIL on Pap smear of cervix Positive for high risk HPV and LSIL on last pap smear in 02/2022. Indicated per ASCCP guidelines for colposcopy. Scheduled for [facility] and colposcopy on 12/19 @9 :30am. Patient agreeable to plan.  Precepted with Dr. Deirdre Priest  Return in about 4 weeks (around 07/22/2023).  Celine Mans, MD [facility]

## 2023-06-24 NOTE — Assessment & Plan Note (Signed)
Positive for high risk HPV and LSIL on last pap smear in 02/2022. Indicated per ASCCP guidelines for colposcopy. Scheduled for [facility] Clinic and colposcopy on 12/19 @9 :30am. Patient agreeable to plan.

## 2023-06-26 LAB — CERVICOVAGINAL ANCILLARY ONLY
Chlamydia: NEGATIVE
Comment: NEGATIVE
Comment: NEGATIVE
Comment: NORMAL
Neisseria Gonorrhea: NEGATIVE
Trichomonas: NEGATIVE

## 2023-06-27 ENCOUNTER — Other Ambulatory Visit: Payer: Self-pay | Admitting: Family Medicine

## 2023-06-27 ENCOUNTER — Ambulatory Visit (INDEPENDENT_AMBULATORY_CARE_PROVIDER_SITE_OTHER): Payer: PRIVATE HEALTH INSURANCE | Admitting: Family Medicine

## 2023-06-27 VITALS — BP 112/62 | HR 86 | Wt 199.0 lb

## 2023-06-27 DIAGNOSIS — R87612 Low grade squamous intraepithelial lesion on cytologic smear of cervix (LGSIL): Secondary | ICD-10-CM | POA: Diagnosis not present

## 2023-06-27 DIAGNOSIS — N898 Other specified noninflammatory disorders of vagina: Secondary | ICD-10-CM

## 2023-06-27 LAB — POCT URINE PREGNANCY: Preg Test, Ur: NEGATIVE

## 2023-06-27 NOTE — Progress Notes (Signed)
    SUBJECTIVE:   CHIEF COMPLAINT / HPI: colposcopy evaluation  No new concerns or complaints today. Discussed negative STI tests from previous. Endorses understanding of need for colposcopy and biopsy today.  PERTINENT  PMH / PSH: LSIL, HPV+  OBJECTIVE:   BP 112/62   Pulse 86   Wt 199 lb (90.3 kg)   SpO2 98%   BMI 35.25 kg/m   General: NAD, well appearing Neuro: A&O Respiratory: normal WOB on RA Extremities: Moving all 4 extremities equally GU: Normal external genitalia, no obvious erythema, swelling, or lesions. Mild white discharge, vaginal walls without erythema or lesions. Cervix round, red, no lesions.   ASSESSMENT/PLAN:   Assessment & Plan LGSIL on Pap smear of cervix HPV+ as well. Colposcopy performed today as below. Biopsy and endocervical curettage performed and waiting pathology results. Conization versus LEEP verus continued monitoring pending results. Patient agreeable to plan. Vaginal discharge Previous concern for STI from my visit on 12/16. Wet prep and cervicovaginal ancillary testing negative. Patient unable to get RPR and HIV testing done due to lab closure. Patient agreeable for testing today. Ordered.   Procedure:  Pre-Procedure  ? Consent signed and reviewed with patient: Side effects, risks and complications discussed (including bleeding, infection, vaginal discharge, pain or discomfort, non-diagnostic colposcopy result) Procedure Vitals: reviewed BMI: 35.22 Colposcopy of: Cervix  Need for: Large speculum  The vulva, vagina and perianal regions were examined with findings of:  no gross abnormalities, mild white discharge  A speculum was placed, and the cervix and vagina was treated with: Diluted acetic acid, Lugol's solution Cervix: Fully visualized Squamocolumnar junction:  Fully visualized Acetowhite or non-Lugol's staining: visualized lesion at 12 oclock position Acetowhite or non-Lugol's staining lesion(s) description, including location, size  and features: 12 oclock, flat pale discoloration   Cervical biopsies obtained (Note: 2 or more biopsies increase the sensitivity of colposcopy): ? Cervical biopsy at _12_ o'clock performed Endocervical curettage with: ? curette ?   Hemostatic measures used: ? None ? There were no complications. ____________________________________________________ ? Patient tolerated procedure: ? well  Dr. Lum Babe present for procedure.  Return if symptoms worsen or fail to improve.  Celine Mans, MD [facility]

## 2023-06-27 NOTE — Assessment & Plan Note (Signed)
HPV+ as well. Colposcopy performed today as below. Biopsy and endocervical curettage performed and waiting pathology results. Conization versus LEEP verus continued monitoring pending results. Patient agreeable to plan.

## 2023-06-27 NOTE — Patient Instructions (Addendum)
It was great to see you! Thank you for allowing me to participate in your care!  Colposcopy A colposcopy is a procedure to find out whether there are abnormal cells on or in a woman's cervix or vagina. The cervix is the part of the womb that sits in the vagina.  Abnormalities tend to occur at the opening of the cervix to the birth canal, where it enters the womb. A colposcopy allows a doctor or trained nurse to find these abnormalities.  In some women, the presence of 'abnormal cells' carries the risk of developing cervical cancer. A colposcopy is used to determine whether treatment will be needed to deal with these cells.  After your colposcopy You should be able to continue with your daily activities after your appointment, including driving. For a few days after your colposcopy, you may have a brownish vaginal discharge, or light bleeding if you had a biopsy. This is normal and will usually stop after 3 to 5 days.  We will notify you with results in the next few days. Please refrain from sexual activity for the next 3 days.    Please arrive 15 minutes PRIOR to your next scheduled appointment time! If you do not, this affects OTHER patients' care.  Take care and seek immediate care sooner if you develop any concerns.   Celine Mans, MD, PGY-2 [facility] 10:00 AM 06/27/2023  [facility]

## 2023-06-28 LAB — HIV ANTIBODY (ROUTINE TESTING W REFLEX): HIV Screen 4th Generation wRfx: NONREACTIVE

## 2023-06-28 LAB — RPR: RPR Ser Ql: NONREACTIVE

## 2023-06-28 NOTE — Progress Notes (Signed)
FMTS Attending  Note: Denny Levy MD I have discussed this patient with the resident and reviewed the assessment and plan as documented above. I was immediately available as a preceptor for this visit  Dr. Lum Babe physically supervised the procedure and I have discussed with her as well.

## 2023-07-01 LAB — SURGICAL PATHOLOGY

## 2023-07-02 ENCOUNTER — Encounter: Payer: Self-pay | Admitting: Family Medicine

## 2023-12-17 ENCOUNTER — Encounter: Payer: Self-pay | Admitting: *Deleted

## 2024-07-16 ENCOUNTER — Ambulatory Visit: Admitting: Family Medicine

## 2024-07-16 ENCOUNTER — Encounter: Payer: Self-pay | Admitting: Family Medicine

## 2024-07-16 VITALS — BP 116/72 | HR 86 | Ht 62.0 in | Wt 193.6 lb

## 2024-07-16 DIAGNOSIS — G8929 Other chronic pain: Secondary | ICD-10-CM | POA: Diagnosis not present

## 2024-07-16 DIAGNOSIS — M25561 Pain in right knee: Secondary | ICD-10-CM

## 2024-07-16 DIAGNOSIS — N898 Other specified noninflammatory disorders of vagina: Secondary | ICD-10-CM | POA: Diagnosis not present

## 2024-07-16 MED ORDER — MELOXICAM 15 MG PO TABS: 15.0000 mg | ORAL_TABLET | Freq: Every day | ORAL | 0 refills | Status: AC

## 2024-07-16 NOTE — Patient Instructions (Addendum)
 VISIT SUMMARY: Today, you were seen for knee pain and vaginal discharge. We discussed your symptoms, performed necessary examinations, and outlined a treatment plan to address your concerns.  YOUR PLAN: RIGHT KNEE EFFUSION AND PAIN: You have intermittent pain and swelling in your knee, which may be due to fluid accumulation or inflammation. -You were prescribed meloxicam  for one week to help with the pain and inflammation. -You are referred to physical therapy for knee rehabilitation. -If your symptoms persist, we may consider a knee x-ray.  VAGINAL DISCHARGE: You have been experiencing persistent vaginal discharge with a fishy odor and occasional itching, which may be due to an infection. -We performed a pelvic exam and collected a swab to test for gonorrhea, chlamydia, trichomonas, and bacterial vaginosis. -We discussed the potential use of vaginal probiotics if needed.  Please let me know if you have any other questions.  Dr. Tharon

## 2024-07-16 NOTE — Progress Notes (Signed)
" ° °  SUBJECTIVE:   CHIEF COMPLAINT / HPI:  Discussed the use of AI scribe software for clinical note transcription with the patient, who gave verbal consent to proceed.  History of Present Illness Sabrina Buckley is a 39 year old female who presents with knee pain and vaginal discharge.  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 - Sensation of swelling and tightness in the knee - Pain worsens with movement at work, bending, or kneeling - Feels the knee may give out - Pain is milder at home when resting - History of prior knee problems a few years ago without x-ray evaluation; at that time the pain was bilateral  Vaginal discharge - Persistent vaginal discharge with fishy odor for at least 1 to 2 years - Discharge is heavy, sometimes thick and white, other times yellowish - Intermittent itching at the vaginal opening - No burning sensation - Last unprotected intercourse with a female partner in November, with no change in symptoms since, no protection used - Not on birth control; tubal ligation hx  OBJECTIVE:  BP 116/72   Pulse 86   Ht 5' 2 (1.575 m)   Wt 193 lb 9.6 oz (87.8 kg)   LMP 07/02/2024 (Approximate)   SpO2 97%   BMI 35.41 kg/m   Physical Exam GENERAL: Alert, cooperative, well developed, no acute distress. GENITOURINARY: Vaginal canal with scant thin white discharge, mildly bleeding tissue after sample collected around cervix, no discrete lesions. Tolerated procedure well with chaperone RN Pipkin. MUSCULOSKELETAL: No erythema or significant edema of R knee when compared to the L, TTP over R patellar tendon and with patella movement, ROM intact though with pain with full flexion and crepitus, pain with valgus and varus testing globally though without true instability, pain also with anterior and posterior drawer testing though again without true instability, pain with Thessaly maneuver but without  instability NEUROLOGICAL: Cranial nerves grossly intact, moves all extremities without gross motor or sensory deficit.  Ultrasound limited R knee: Quadriceps tendon intact. Mild fluid collection visualized underneath suprapatellar fat pad.  ASSESSMENT/PLAN:   Assessment & Plan Chronic pain of right knee Intermittent pain and swelling now acutely worse with fluid accumulation on ultrasound, suggesting inflammation. Differential includes arthritis (though young for this), patellofemoral syndrome (worsened with full flexion of knee and patellar movement), meniscal injury (no true stability on exam). - Ordered knee ultrasound to assess for joint fluid. - Prescribed meloxicam  for one week. - Referred to physical therapy for knee rehabilitation. - Consider knee x-ray if symptoms persist. Vaginal discharge Recurrent discharge with fishy odor and occasional itching. Exam overall reassuring. Differential includes bacterial vaginosis or other vaginitis. Previous tests negative, symptoms persist for now over a year, could also consider mycoplasma genitalium vs ureaplasma as cause. - Performed pelvic exam and collected swab for gonorrhea, chlamydia, trichomonas, and bacterial vaginosis. - Discussed potential use of vaginal probiotics if indicated. - Upon review of her history of +HPV, LSIL, and normal colposcopy results in 2024, she will need a pap smear at her next visit.  Stuart Redo, MD [facility]  "

## 2024-07-20 ENCOUNTER — Ambulatory Visit: Payer: Self-pay | Admitting: Family Medicine

## 2024-07-20 LAB — CERVICOVAGINAL ANCILLARY ONLY
Bacterial Vaginitis (gardnerella): POSITIVE — AB
Candida Glabrata: NEGATIVE
Candida Vaginitis: NEGATIVE
Chlamydia: NEGATIVE
Comment: NEGATIVE
Comment: NEGATIVE
Comment: NEGATIVE
Comment: NEGATIVE
Comment: NEGATIVE
Comment: NORMAL
Neisseria Gonorrhea: NEGATIVE
Trichomonas: POSITIVE — AB

## 2024-07-20 MED ORDER — METRONIDAZOLE 500 MG PO TABS: 500.0000 mg | ORAL_TABLET | Freq: Two times a day (BID) | ORAL | 0 refills | Status: AC

## 2024-08-03 ENCOUNTER — Ambulatory Visit: Admitting: Physical Therapy

## 2024-08-11 ENCOUNTER — Other Ambulatory Visit: Payer: Self-pay

## 2024-08-11 ENCOUNTER — Ambulatory Visit

## 2024-08-11 DIAGNOSIS — G8929 Other chronic pain: Secondary | ICD-10-CM

## 2024-08-11 DIAGNOSIS — R600 Localized edema: Secondary | ICD-10-CM

## 2024-08-11 DIAGNOSIS — M6281 Muscle weakness (generalized): Secondary | ICD-10-CM

## 2024-08-11 DIAGNOSIS — R262 Difficulty in walking, not elsewhere classified: Secondary | ICD-10-CM

## 2024-08-17 ENCOUNTER — Ambulatory Visit: Admitting: Physical Therapy

## 2024-08-26 ENCOUNTER — Ambulatory Visit: Admitting: Physical Therapy

## 2024-09-02 ENCOUNTER — Ambulatory Visit: Admitting: Physical Therapy

## 2024-09-09 ENCOUNTER — Ambulatory Visit: Admitting: Physical Therapy
